# Patient Record
Sex: Male | Born: 1945 | Race: White | Hispanic: No | Smoking: Former smoker
Health system: Southern US, Community
[De-identification: ages and names within clinical notes are randomized; demographics above are authoritative.]

## PROBLEM LIST (undated history)

## (undated) DIAGNOSIS — G709 Myoneural disorder, unspecified: Secondary | ICD-10-CM

## (undated) DIAGNOSIS — L300 Nummular dermatitis: Secondary | ICD-10-CM

## (undated) DIAGNOSIS — N451 Epididymitis: Secondary | ICD-10-CM

## (undated) DIAGNOSIS — K5790 Diverticulosis of intestine, part unspecified, without perforation or abscess without bleeding: Secondary | ICD-10-CM

## (undated) DIAGNOSIS — F32A Depression, unspecified: Secondary | ICD-10-CM

## (undated) DIAGNOSIS — R079 Chest pain, unspecified: Secondary | ICD-10-CM

## (undated) DIAGNOSIS — N419 Inflammatory disease of prostate, unspecified: Secondary | ICD-10-CM

## (undated) DIAGNOSIS — K648 Other hemorrhoids: Secondary | ICD-10-CM

## (undated) DIAGNOSIS — M1990 Unspecified osteoarthritis, unspecified site: Secondary | ICD-10-CM

## (undated) DIAGNOSIS — K219 Gastro-esophageal reflux disease without esophagitis: Secondary | ICD-10-CM

## (undated) DIAGNOSIS — K4090 Unilateral inguinal hernia, without obstruction or gangrene, not specified as recurrent: Secondary | ICD-10-CM

## (undated) DIAGNOSIS — Z8639 Personal history of other endocrine, nutritional and metabolic disease: Secondary | ICD-10-CM

## (undated) DIAGNOSIS — E785 Hyperlipidemia, unspecified: Secondary | ICD-10-CM

## (undated) DIAGNOSIS — F419 Anxiety disorder, unspecified: Secondary | ICD-10-CM

## (undated) DIAGNOSIS — E559 Vitamin D deficiency, unspecified: Secondary | ICD-10-CM

## (undated) HISTORY — DX: Nummular dermatitis: L30.0

## (undated) HISTORY — DX: Personal history of other endocrine, nutritional and metabolic disease: Z86.39

## (undated) HISTORY — PX: TONSILLECTOMY: SUR1361

## (undated) HISTORY — DX: Unilateral inguinal hernia, without obstruction or gangrene, not specified as recurrent: K40.90

## (undated) HISTORY — DX: Gastro-esophageal reflux disease without esophagitis: K21.9

## (undated) HISTORY — DX: Inflammatory disease of prostate, unspecified: N41.9

## (undated) HISTORY — PX: INGUINAL HERNIA REPAIR: SHX194

## (undated) HISTORY — DX: Chest pain, unspecified: R07.9

## (undated) HISTORY — DX: Other hemorrhoids: K64.8

## (undated) HISTORY — DX: Vitamin D deficiency, unspecified: E55.9

## (undated) HISTORY — DX: Hyperlipidemia, unspecified: E78.5

## (undated) HISTORY — DX: Diverticulosis of intestine, part unspecified, without perforation or abscess without bleeding: K57.90

## (undated) HISTORY — DX: Epididymitis: N45.1

---

## 2003-04-29 ENCOUNTER — Ambulatory Visit (HOSPITAL_COMMUNITY): Admission: RE | Admit: 2003-04-29 | Discharge: 2003-04-29 | Payer: Self-pay | Admitting: Family Medicine

## 2003-07-12 ENCOUNTER — Ambulatory Visit (HOSPITAL_COMMUNITY): Admission: RE | Admit: 2003-07-12 | Discharge: 2003-07-12 | Payer: Self-pay | Admitting: Gastroenterology

## 2004-02-10 ENCOUNTER — Ambulatory Visit (HOSPITAL_COMMUNITY): Admission: RE | Admit: 2004-02-10 | Discharge: 2004-02-10 | Payer: Self-pay | Admitting: Family Medicine

## 2008-07-28 ENCOUNTER — Ambulatory Visit (HOSPITAL_COMMUNITY): Admission: RE | Admit: 2008-07-28 | Discharge: 2008-07-28 | Payer: Self-pay | Admitting: Surgery

## 2010-02-12 ENCOUNTER — Encounter: Payer: Self-pay | Admitting: Family Medicine

## 2010-04-30 LAB — CBC
HCT: 42.8 % (ref 39.0–52.0)
Hemoglobin: 14.6 g/dL (ref 13.0–17.0)
MCHC: 34.1 g/dL (ref 30.0–36.0)
MCV: 88.7 fL (ref 78.0–100.0)
RBC: 4.83 MIL/uL (ref 4.22–5.81)
WBC: 4.3 10*3/uL (ref 4.0–10.5)

## 2010-04-30 LAB — DIFFERENTIAL
Basophils Relative: 1 % (ref 0–1)
Eosinophils Absolute: 0.1 10*3/uL (ref 0.0–0.7)
Eosinophils Relative: 2 % (ref 0–5)
Lymphs Abs: 1.3 10*3/uL (ref 0.7–4.0)
Monocytes Absolute: 0.3 10*3/uL (ref 0.1–1.0)
Monocytes Relative: 8 % (ref 3–12)

## 2010-04-30 LAB — BASIC METABOLIC PANEL
CO2: 31 mEq/L (ref 19–32)
Chloride: 107 mEq/L (ref 96–112)
Creatinine, Ser: 0.87 mg/dL (ref 0.4–1.5)
GFR calc Af Amer: >60 mL/min (ref >60)
Potassium: 4.9 mEq/L (ref 3.5–5.1)

## 2010-06-06 NOTE — Op Note (Signed)
Kyle Williamson, Kyle Williamson              ACCOUNT NO.:  [id number]   MEDICAL RECORD NO.:  [id number]          PATIENT TYPE:  AMB   LOCATION:  SDS                          FACILITY:  [facility]   PHYSICIAN:  Wilmon Arms. Corliss Skains, M.D. DATE OF BIRTH:  [birth year]   DATE OF PROCEDURE:  07/28/2008  DATE OF DISCHARGE:  07/28/2008                               OPERATIVE REPORT   PREOPERATIVE DIAGNOSIS:  Left inguinal hernia.   POSTOPERATIVE DIAGNOSIS:  Left direct inguinal hernia.   PROCEDURE PERFORMED:  Left inguinal hernia repair with mesh.   SURGEON:  Wilmon Arms. Corliss Skains, MD   ANESTHESIA:  General via LMA.   INDICATIONS:  This is an healthy 65 year old male who presents with 3-  month history of a palpable bulge in his left groin, which has become  easily visible.  It is reducible.  He is minimally symptomatic.  He was  examined in the office and was felt to have a left inguinal hernia.  Presents now for repair.   DESCRIPTION OF PROCEDURE:  The patient was brought to the operating room  and placed in supine position on the operating table.  After an adequate  level of general anesthesia was obtained, the patient's left groin was  shaved and prepped with Betadine and draped in sterile fashion.  A time-  out was taken to assure proper patient and proper procedure.  We  infiltrated the area above the left inguinal ligament with 0.25%  Marcaine with epinephrine.  We made an oblique incision and dissection  was carried down in left fascia.  External oblique fascia was opened  along and retracted in its fibers down to the external ring.  We bluntly  dissected around a very diminutive spermatic cord.  The patient has a  large direct hernia defect.  The direct hernia bulge was dissected free  from the surrounding tissue and reduced up on the preperitoneal space.  The floor of the inguinal canal was then closed with a running zero PDS  suture.  The spermatic cord was examined and there is no sign of  indirect hernia.  Once pro mesh was cut into keyhole shape, wound was  secured at beginning at the pubic tubercle with 2-0 Prolene.  We ran  this along the shelving edge inferiorly and the internal oblique fascia  superiorly.  The tails were sutured together behind the spermatic cord.  Tails were then tucked underneath the external oblique fascia.  The  fascia was reapproximated with 2-0 Vicryl.  A 3-0 Vicryl was used to  close subcutaneous tissues and 4-0 Monocryl was used to close the skin.  Steri-Strips and clean dressings were applied.  The patient was then  extubated and brought to recovery room in stable condition.  All sponge,  instrument, and needle counts were correct.      Wilmon Arms. Tsuei, M.D.  Electronically Signed    MKT/MEDQ  D:  07/28/2008  T:  07/29/2008  Job:  [id number]   cc:   Tally Joe, M.D.

## 2010-06-09 NOTE — Op Note (Signed)
Kyle Williamson, Kyle Williamson                        ACCOUNT NO.:  [id number]   MEDICAL RECORD NO.:  [id number]                   PATIENT TYPE:  AMB   LOCATION:  ENDO                                 FACILITY:  [facility]   PHYSICIAN:  Graylin Shiver, M.D.                DATE OF BIRTH:  [birth year]   DATE OF PROCEDURE:  07/12/2003  DATE OF DISCHARGE:                                 OPERATIVE REPORT   PROCEDURE:  Colonoscopy.   INDICATIONS FOR PROCEDURE:  Rectal bleeding.   CONSENT:  Informed consent was obtained after explanation of the risks of  bleeding, infection, and perforation.   PREMEDICATION:  Fentanyl 75 mcg IV, Versed 8 mg IV.   DESCRIPTION OF PROCEDURE:  With the patient in the left lateral decubitus  position, the rectal exam was performed.  No masses were felt.  The Olympus  colonoscope was inserted into the rectum and advanced around the colon to  the cecum.  Cecal landmarks were identified.  The cecum and ascending colon  were normal.  The transverse colon was normal.  The descending colon was  normal.  The sigmoid showed some diverticula.  The rectum was normal.  He  tolerated the procedure well without complications.   IMPRESSION:  Diverticulosis of the sigmoid colon.                                               Graylin Shiver, M.D.    Germain Osgood  D:  07/12/2003  T:  07/12/2003  Job:  [id number]   cc:   Tally Joe, M.D.  [address]  [place], [place] [address]  Fax: [phone]

## 2012-01-30 ENCOUNTER — Other Ambulatory Visit: Payer: Self-pay | Admitting: Family Medicine

## 2012-01-30 DIAGNOSIS — R109 Unspecified abdominal pain: Secondary | ICD-10-CM

## 2012-01-31 ENCOUNTER — Ambulatory Visit
Admission: RE | Admit: 2012-01-31 | Discharge: 2012-01-31 | Disposition: A | Discharge status: Home / Self Care | Payer: Medicare Other | Source: Ambulatory Visit | Attending: Family Medicine | Admitting: Family Medicine

## 2012-01-31 DIAGNOSIS — R109 Unspecified abdominal pain: Secondary | ICD-10-CM

## 2012-01-31 MED ORDER — IOHEXOL 300 MG/ML  SOLN
100.0000 mL | Freq: Once | INTRAMUSCULAR | Status: AC | PRN
  Administered 2012-01-31: 100 mL via INTRAVENOUS

## 2012-07-03 ENCOUNTER — Other Ambulatory Visit: Payer: Self-pay | Admitting: Family Medicine

## 2012-07-03 DIAGNOSIS — R911 Solitary pulmonary nodule: Secondary | ICD-10-CM

## 2012-07-09 ENCOUNTER — Ambulatory Visit
Admission: RE | Admit: 2012-07-09 | Discharge: 2012-07-09 | Disposition: A | Discharge status: Home / Self Care | Payer: Medicare Other | Source: Ambulatory Visit | Attending: Family Medicine | Admitting: Family Medicine

## 2012-07-09 DIAGNOSIS — R911 Solitary pulmonary nodule: Secondary | ICD-10-CM

## 2012-07-09 MED ORDER — IOHEXOL 300 MG/ML  SOLN
75.0000 mL | Freq: Once | INTRAMUSCULAR | Status: AC | PRN
  Administered 2012-07-09: 75 mL via INTRAVENOUS

## 2014-03-05 ENCOUNTER — Other Ambulatory Visit: Payer: Self-pay | Admitting: Family Medicine

## 2014-03-05 DIAGNOSIS — Z139 Encounter for screening, unspecified: Secondary | ICD-10-CM

## 2014-03-22 ENCOUNTER — Ambulatory Visit
Admission: RE | Admit: 2014-03-22 | Discharge: 2014-03-22 | Disposition: A | Discharge status: Home / Self Care | Payer: Medicare Other | Source: Ambulatory Visit | Attending: Family Medicine | Admitting: Family Medicine

## 2014-03-22 DIAGNOSIS — Z139 Encounter for screening, unspecified: Secondary | ICD-10-CM

## 2016-01-30 IMAGING — US US AORTA SCREENING (MEDICARE)
1 series · 12 of 12 positions shown · non-contrast
Comparison: CT scan of January 31, 2012.

CLINICAL DATA: Medicare screening exam for abdominal aortic
aneurysm.

EXAM:
ABDOMINAL AORTA SCREENING ULTRASOUND
TECHNIQUE: Ultrasound examination of the abdominal aorta was performed as a
screening evaluation for abdominal aortic aneurysm.

[Series 1: us aorta screening (medicare) · 0.26mm/px · 12 of 12 slices shown]
[im 1/12]
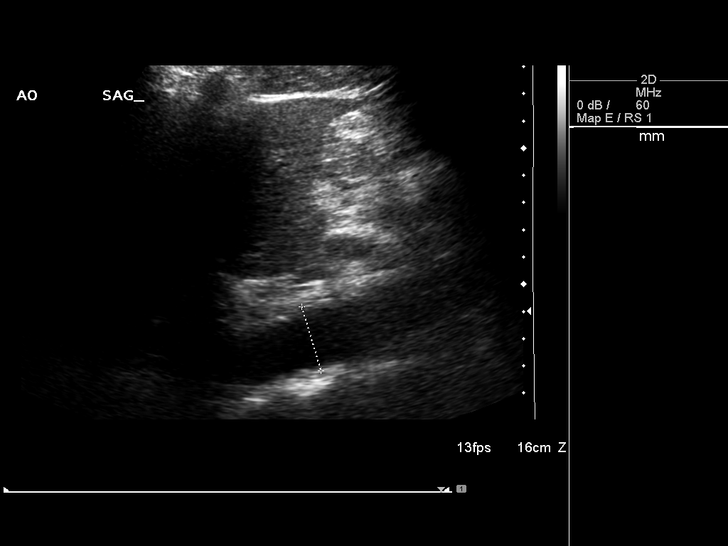
[im 2/12]
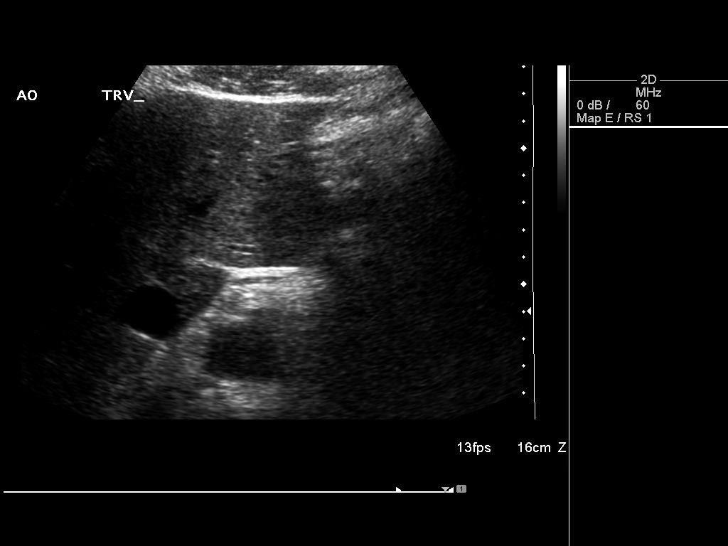
[im 3/12]
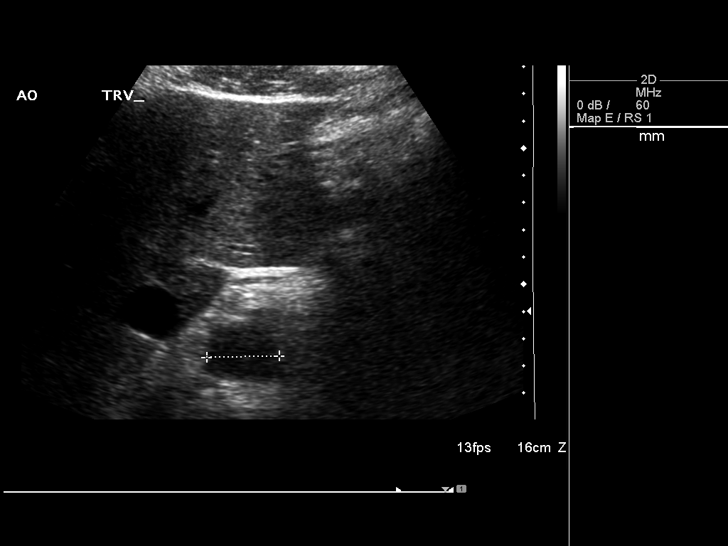
[im 4/12]
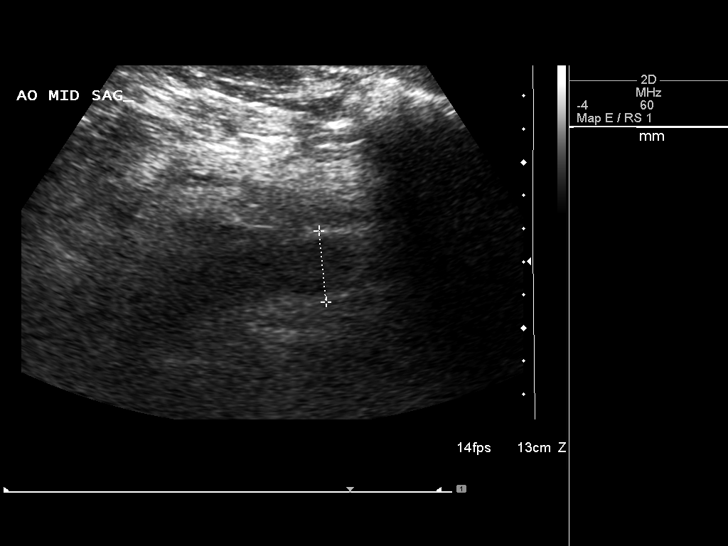
[im 5/12]
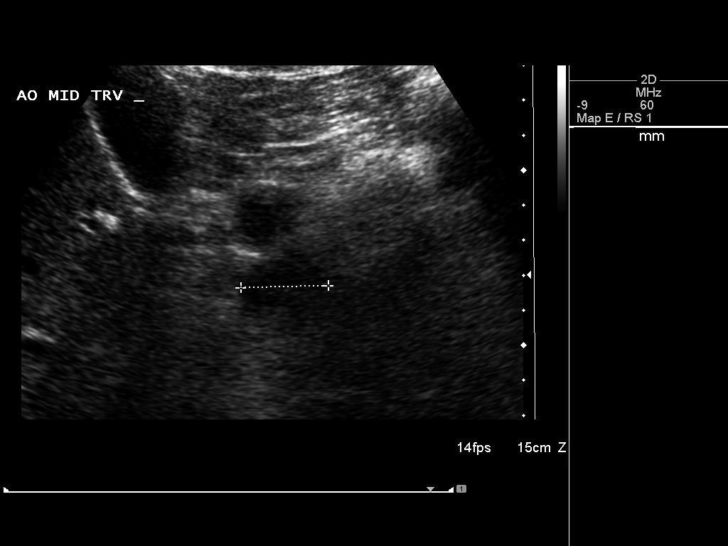
[im 6/12]
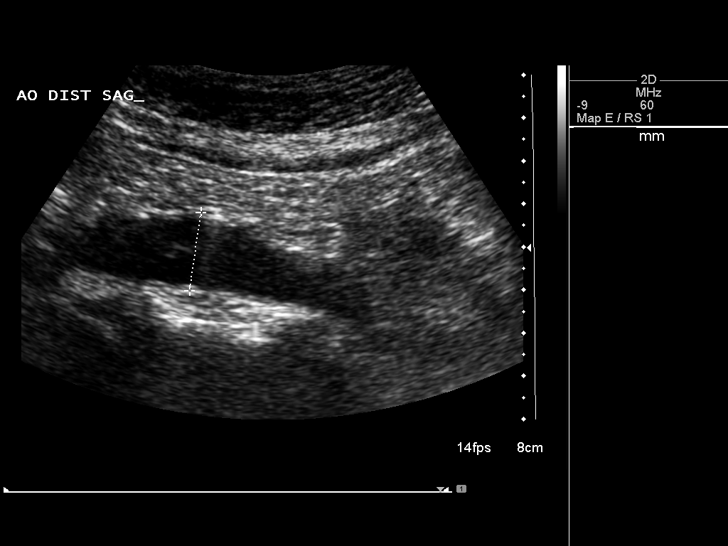
[im 7/12]
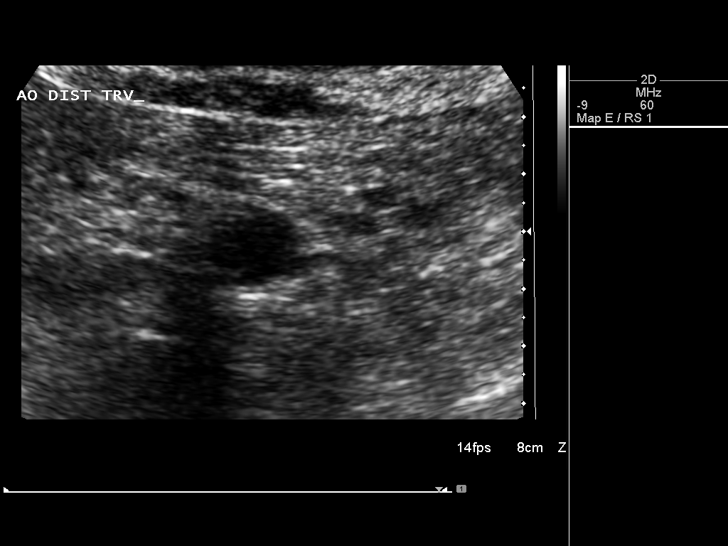
[im 8/12]
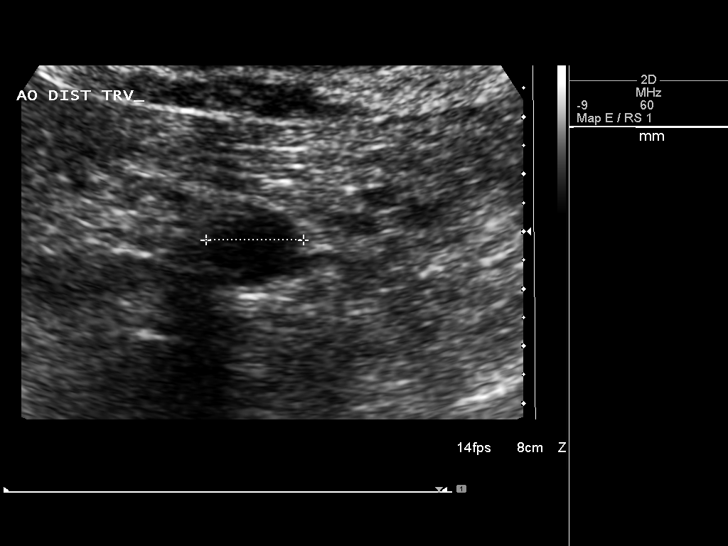
[im 9/12]
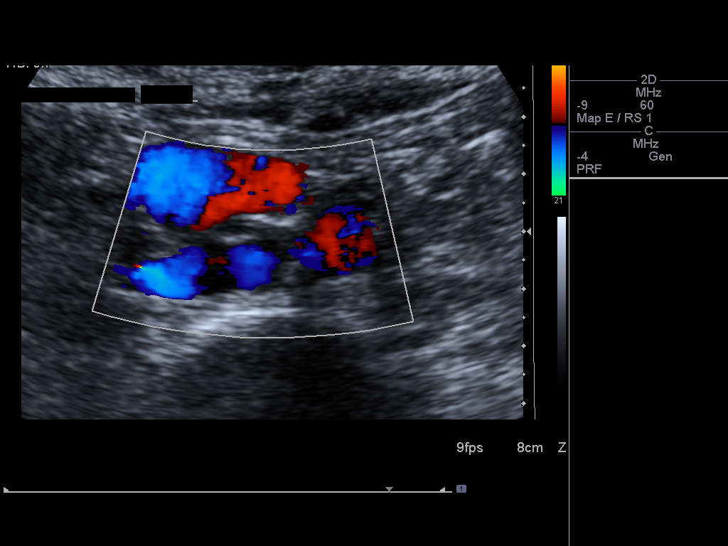
[im 10/12]
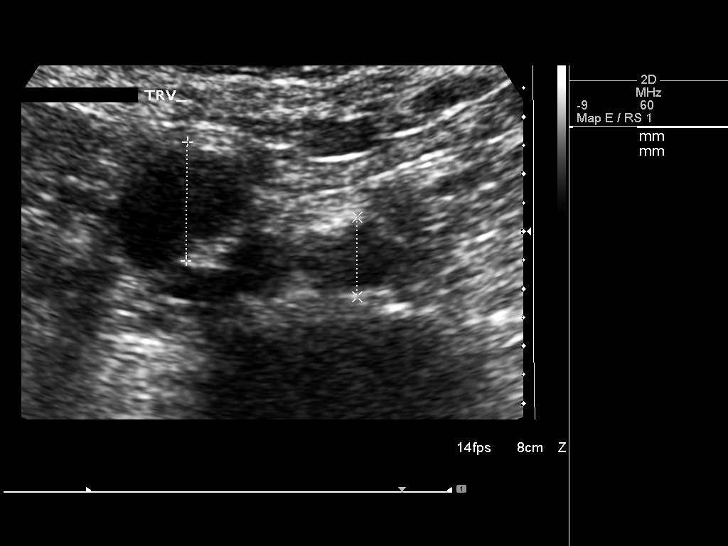
[im 11/12]
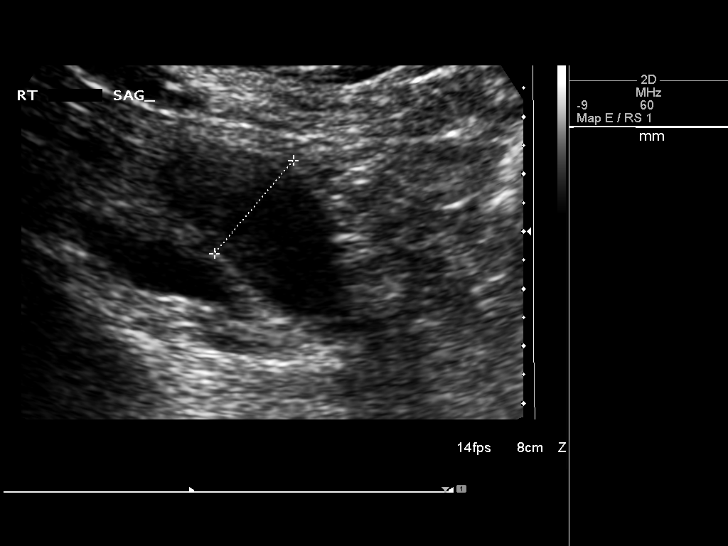
[im 12/12]
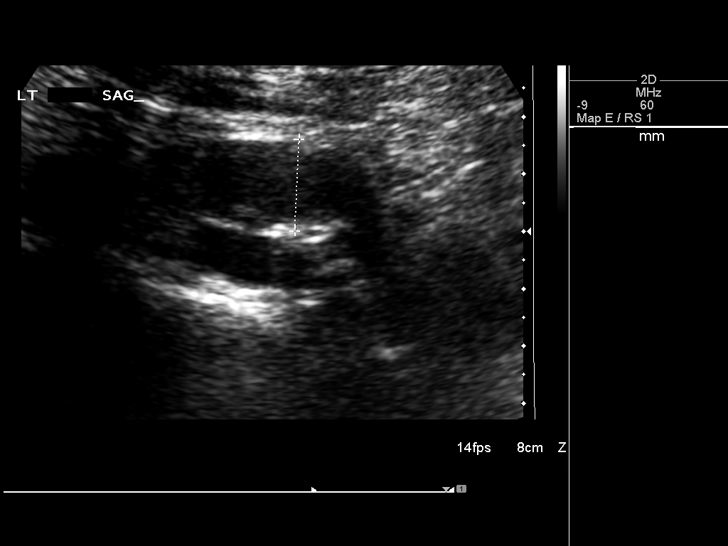

[12 of 12 positions shown; findings below may reference images not displayed]

FINDINGS: Abdominal Aorta

No aneurysm identified.

Maximum AP

Diameter:  2.5 cm proximally.

Maximum TRV

Diameter: 2.7 cm proximally.

Right common iliac artery has maximum measured diameter of 2.1 cm.
Left common iliac artery has max measured diameter 1.6 cm.
IMPRESSION: No evidence of abdominal aortic aneurysm. Aneurysmal dilatation of
right common iliac artery is noted with maximum measured diameter of
2.1 cm.

## 2016-03-19 ENCOUNTER — Other Ambulatory Visit: Payer: Self-pay | Admitting: Family Medicine

## 2016-03-19 DIAGNOSIS — I723 Aneurysm of iliac artery: Secondary | ICD-10-CM

## 2016-03-29 ENCOUNTER — Ambulatory Visit
Admission: RE | Admit: 2016-03-29 | Discharge: 2016-03-29 | Disposition: A | Discharge status: Home / Self Care | Payer: Medicare Other | Source: Ambulatory Visit | Attending: Family Medicine | Admitting: Family Medicine

## 2016-03-29 DIAGNOSIS — I723 Aneurysm of iliac artery: Secondary | ICD-10-CM

## 2016-07-05 ENCOUNTER — Ambulatory Visit (INDEPENDENT_AMBULATORY_CARE_PROVIDER_SITE_OTHER): Payer: Medicare Other | Admitting: Family Medicine

## 2016-07-05 ENCOUNTER — Ambulatory Visit (INDEPENDENT_AMBULATORY_CARE_PROVIDER_SITE_OTHER): Payer: Medicare Other

## 2016-07-05 ENCOUNTER — Encounter: Payer: Self-pay | Admitting: Family Medicine

## 2016-07-05 VITALS — BP 134/60 | HR 76 | Temp 97.8°F | Resp 18 | Ht 67.5 in | Wt 160.2 lb

## 2016-07-05 DIAGNOSIS — Y92009 Unspecified place in unspecified non-institutional (private) residence as the place of occurrence of the external cause: Secondary | ICD-10-CM

## 2016-07-05 DIAGNOSIS — M25532 Pain in left wrist: Secondary | ICD-10-CM

## 2016-07-05 DIAGNOSIS — W19XXXA Unspecified fall, initial encounter: Secondary | ICD-10-CM

## 2016-07-05 NOTE — Progress Notes (Signed)
  Chief Complaint  Patient presents with  . Wrist Injury    Right wrist, fell yesterday and tried catching self, swelling, and hurts to move.    HPI  Pt reports that he fell yesterday  He was trying to catch himself  He fell onto the outstretched right hand and experienced pain and mild swelling He iced it and put on a wrist brace  No past medical history on file.  Current Outpatient Prescriptions  Medication Sig Dispense Refill  . aspirin 81 MG tablet Take 81 mg by mouth daily.    . Turmeric (CURCUMIN 95 PO) Take by mouth.     No current facility-administered medications for this visit.     Allergies:  Allergies  Allergen Reactions  . Simvastatin Other (See Comments)    Per PT joint pain and muscle pain    No past surgical history on file.  Social History   Social History  . Marital status: Divorced    Spouse name: N/A  . Number of children: N/A  . Years of education: N/A   Social History Main Topics  . Smoking status: Former Smoker    Types: Cigarettes  . Smokeless tobacco: Never Used  . Alcohol use None  . Drug use: Unknown  . Sexual activity: Not Asked   Other Topics Concern  . None   Social History Narrative  . None    ROS No fevers or chills No n/v/d  Objective: Vitals:   07/05/16 1538  BP: 134/60  Pulse: 76  Resp: 18  Temp: 97.8 F (36.6 C)  TempSrc: Oral  SpO2: 96%  Weight: 160 lb 3.2 oz (72.7 kg)  Height: 5' 7.5" (1.715 m)    Physical Exam Wrist on right tender on the dorsum No visible bony change Cap refill <2s Snuff box nontender No pain with pronation or supination  FINDINGS: First metacarpal/ carpal degenerative changes. There are also carpal carpal degenerative changes involving the capitate and lunate. Mild radiocarpal joint degenerative changes. Mild first MCP joint degenerative changes. No fracture or dislocation seen.  IMPRESSION: 1. No fracture. 2. Multi joint degenerative changes.   Electronically  Signed   By: Beckie SaltsSteven  Reid M.D.   On: 07/05/2016 16:39  Assessment and Plan Chanetta MarshallJimmy was seen today for wrist injury.  Diagnoses and all orders for this visit:  Left wrist pain -     DG Wrist Complete Right  Fall in home, initial encounter -     DG Wrist Complete Right  Rest, Ice, Elevation, and continue wrist wrap   Jet Traynham A Creta LevinStallings

## 2016-07-05 NOTE — Patient Instructions (Addendum)
   IF you received an x-ray today, you will receive an invoice from [facility]. Please contact [facility] at [phone] with questions or concerns regarding your invoice.   IF you received labwork today, you will receive an invoice from [facility]. Please contact [facility] at [phone] with questions or concerns regarding your invoice.   Our billing staff will not be able to assist you with questions regarding bills from these companies.  You will be contacted with the lab results as soon as they are available. The fastest way to get your results is to activate your My Chart account. Instructions are located on the last page of this paperwork. If you have not heard from us regarding the results in 2 weeks, please contact this office.     Wrist Sprain, Adult A wrist sprain is a stretch or tear in the strong, fibrous tissues (ligaments) that connect your wrist bones. There are three types of wrist sprains:  Grade 1. In this type of sprain, the ligament is stretched more than normal.  Grade 2. In this type of sprain, the ligament is partially torn. You may be able to move your wrist, but not very much.  Grade 3. In this type of sprain, the ligament or muscle is completely torn. You may find it difficult or extremely painful to move your wrist even a little.  What are the causes? A wrist sprain can be caused by using the wrist too much during sports, exercise, or at work. It can also happen with a fall or during an accident. What increases the risk? This condition is more likely to occur in people:  With a previous wrist or arm injury.  With poor wrist strength and flexibility.  Who play contact sports, such as football or soccer.  Who play sports that may result in a fall, such as skateboarding, biking, skiing, or snowboarding.  Who do not exercise regularly.  Who use exercise equipment that does not fit well.  What are the signs or symptoms? Symptoms of  this condition include:  Pain in the wrist, arm, or hand.  Swelling or bruised skin near the wrist, hand, or arm. The skin may look yellow or kind of blue.  Stiffness or trouble moving the hand.  Hearing a pop or feeling a tear at the time of the injury.  A warm feeling in the skin around the wrist.  How is this diagnosed? This condition is diagnosed with a physical exam. Sometimes an X-ray is taken to make sure a bone did not break. If your health care provider thinks that you tore a ligament, he or she may order an MRI of your wrist. How is this treated? This condition is treated by resting and applying ice to your wrist. Additional treatment may include:  Medicine for pain and inflammation.  A splint to keep your wrist still (immobilized).  Exercises to strengthen and stretch your wrist.  Surgery. This may be done if the ligament is completely torn.  Follow these instructions at home: If you have a splint:   Do not put pressure on any part of the splint until it is fully hardened. This may take several hours.  Wear the splint as told by your health care provider. Remove it only as told by your health care provider.  Loosen the splint if your fingers tingle, become numb, or turn cold and blue.  If your splint is not waterproof: ? Do not let it get wet. ? Cover it with   a watertight covering when you take a bath or a shower.  Keep the splint clean. Managing pain, stiffness, and swelling   If directed, put ice on the injured area. ? If you have a removable splint, remove it as told by your health care provider. ? Put ice in a plastic bag. ? Place a towel between your skin and the bag or between the splint and the bag. ? Leave the ice on for 20 minutes, 2-3 times per day.  Move your fingers often to avoid stiffness and to lessen swelling.  Raise (elevate) the injured area above the level of your heart while you are sitting or lying down. Activity  Rest your wrist.  Do not do things that cause pain.  Return to your normal activities as told by your health care provider. Ask your health care provider what activities are safe for you.  Do exercises as told by your health care provider. General instructions  Take over-the-counter and prescription medicines only as told by your health care provider.  Do not use any products that contain nicotine or tobacco, such as cigarettes and e-cigarettes. These can delay healing. If you need help quitting, ask your health care provider.  Ask your health care provider when it is safe to drive if you have a splint.  Keep all follow-up visits as told by your health care provider. This is important. Contact a health care provider if:  Your pain, bruising, or swelling gets worse.  Your skin becomes red, gets a rash, or has open sores.  Your pain does not get better or it gets worse. Get help right away if:  You have a new or sudden sharp pain in the hand, arm, or wrist.  You have tingling or numbness in your hand.  Your fingers turn white, very red, or cold and blue.  You cannot move your fingers. This information is not intended to replace advice given to you by your health care provider. Make sure you discuss any questions you have with your health care provider. Document Released: 09/11/2013 Document Revised: 08/06/2015 Document Reviewed: 07/28/2015 Elsevier Interactive Patient Education  2017 [employer]  

## 2017-05-24 ENCOUNTER — Other Ambulatory Visit: Payer: Self-pay | Admitting: Family Medicine

## 2017-06-18 ENCOUNTER — Other Ambulatory Visit (HOSPITAL_COMMUNITY): Payer: Self-pay | Admitting: Family Medicine

## 2017-06-18 ENCOUNTER — Ambulatory Visit (HOSPITAL_COMMUNITY)
Admission: RE | Admit: 2017-06-18 | Discharge: 2017-06-18 | Disposition: A | Discharge status: Home / Self Care | Payer: Medicare Other | Source: Ambulatory Visit | Attending: Vascular Surgery | Admitting: Vascular Surgery

## 2017-06-18 DIAGNOSIS — I723 Aneurysm of iliac artery: Secondary | ICD-10-CM | POA: Diagnosis not present

## 2018-05-15 IMAGING — DX DG WRIST COMPLETE 3+V*R*
4 series · 4 of 4 positions shown · non-contrast
Comparison: None.

CLINICAL DATA: Right wrist pain following a fall yesterday.

EXAM:
RIGHT WRIST - COMPLETE 3+ VIEW

[wrist pa]
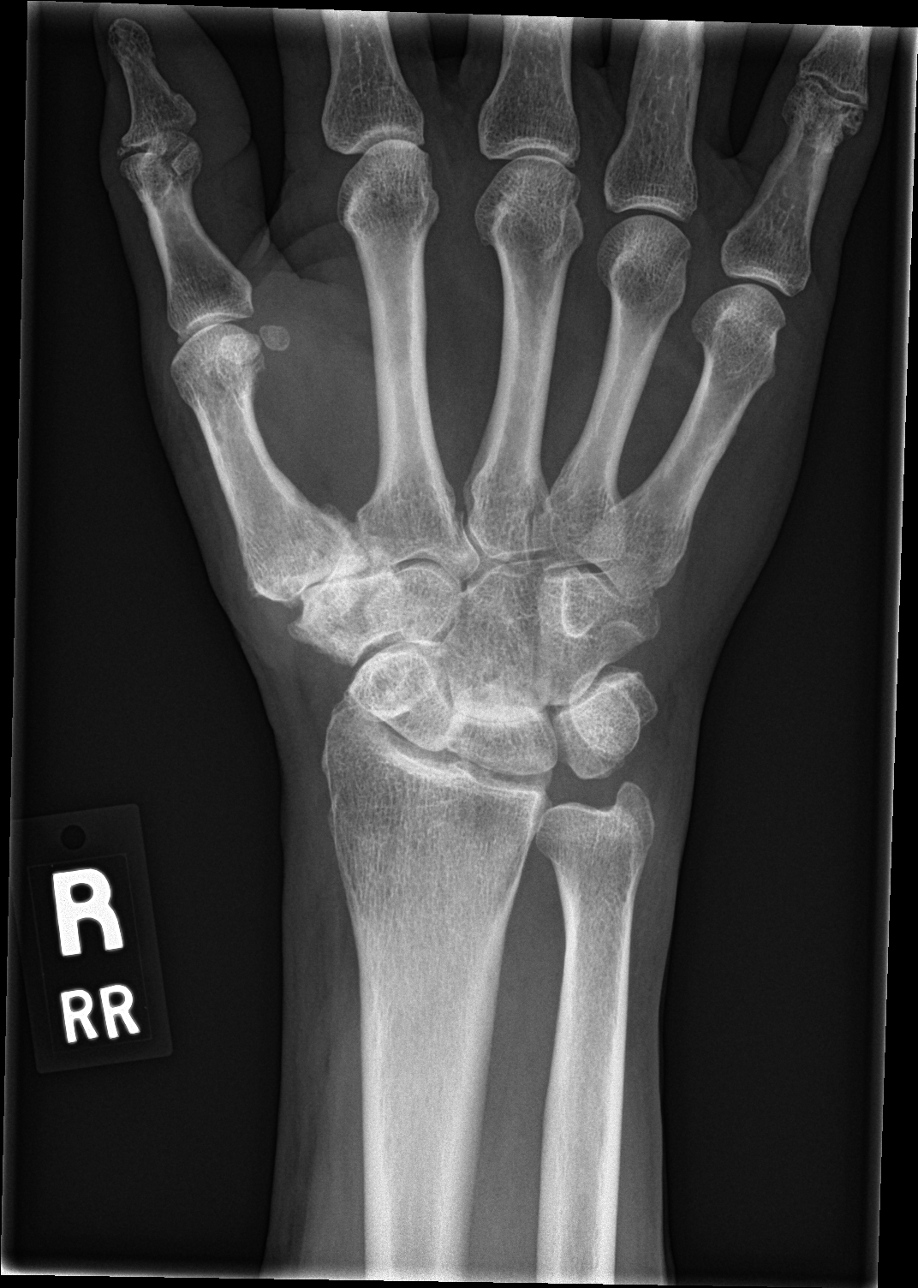

[wrist obl]
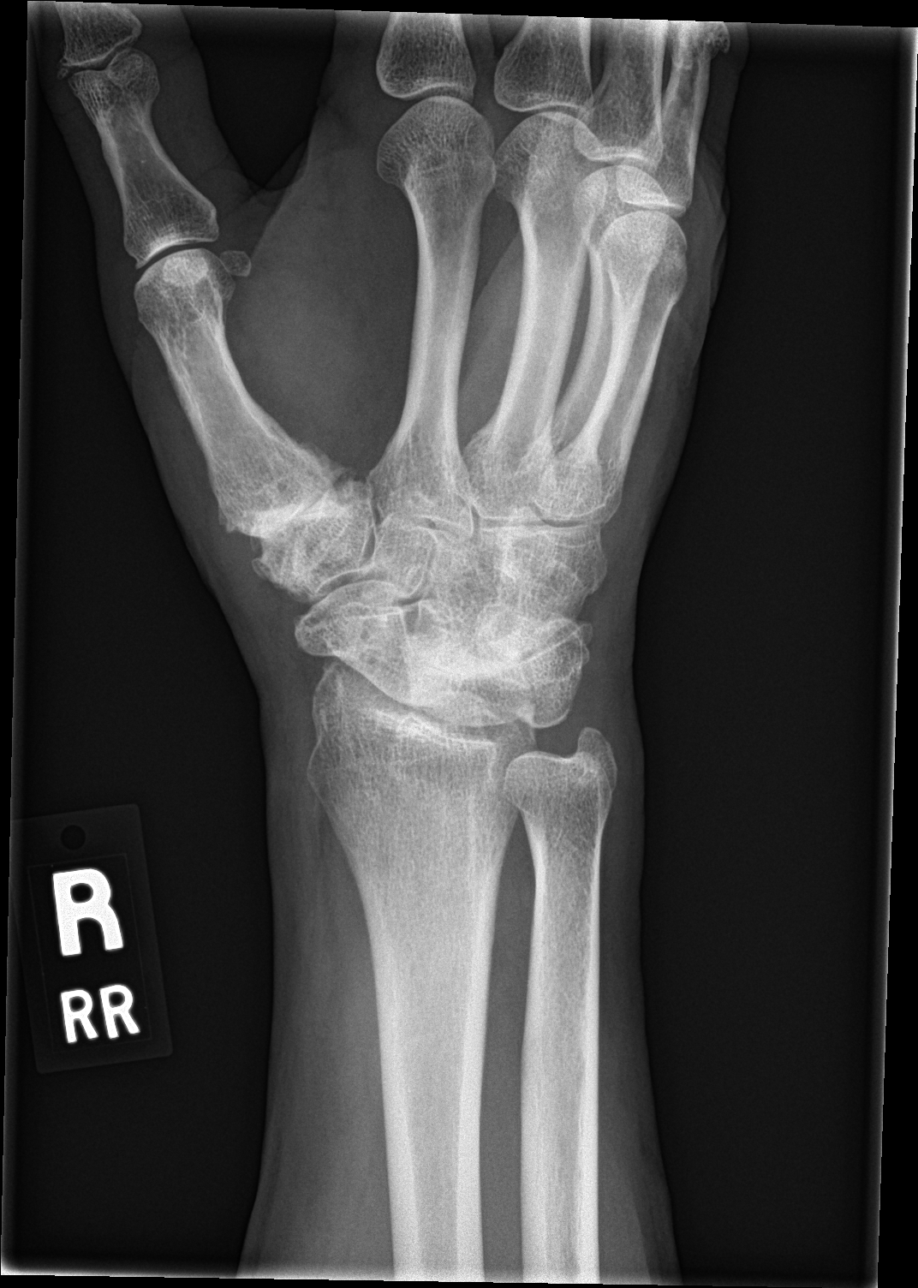

[wrist lat]
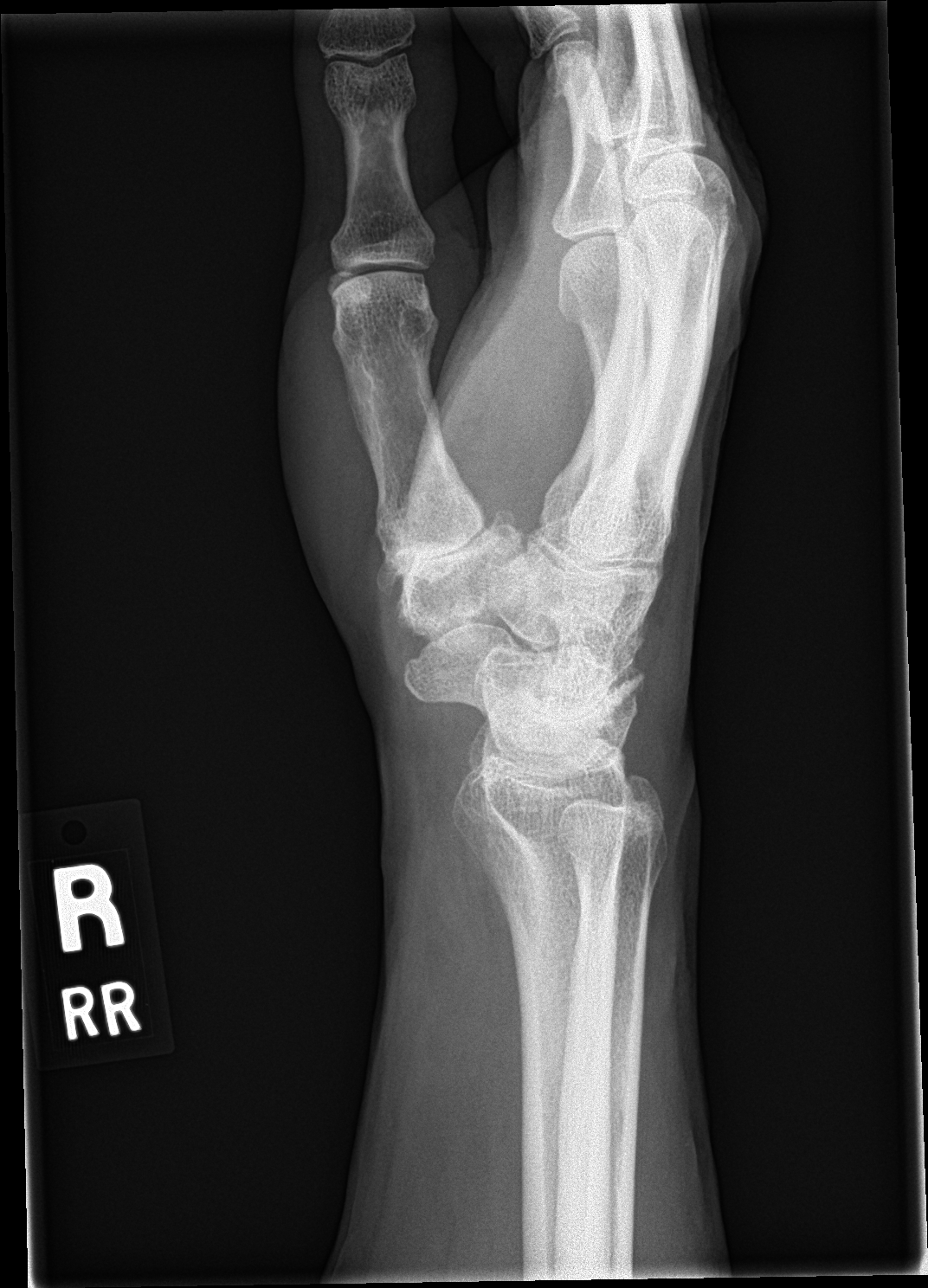

[wrist navicular]
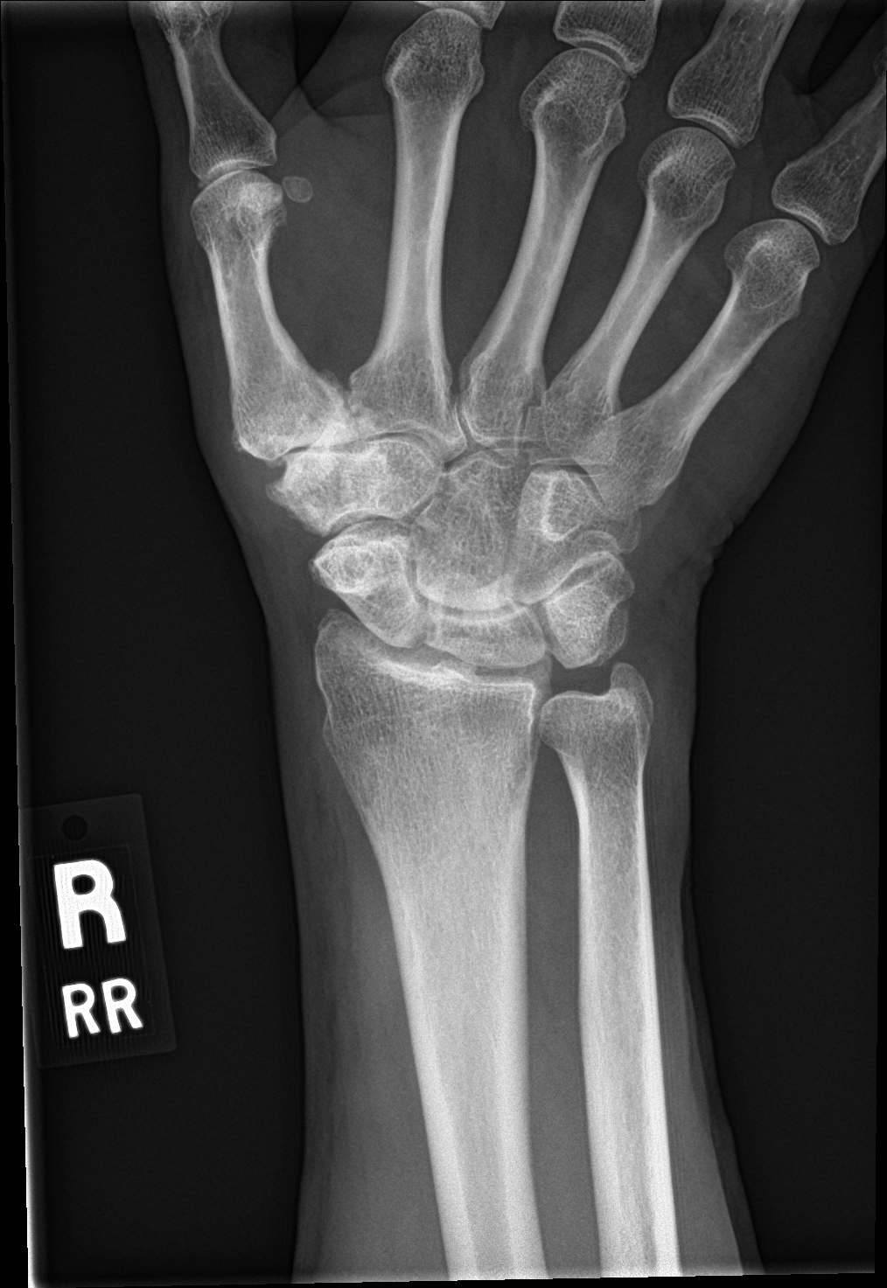

[4 of 4 positions shown; findings below may reference images not displayed]

FINDINGS: First metacarpal/ carpal degenerative changes. There are also carpal
carpal degenerative changes involving the capitate and lunate. Mild
radiocarpal joint degenerative changes. Mild first MCP joint
degenerative changes. No fracture or dislocation seen.
IMPRESSION: 1. No fracture.
2. Multi joint degenerative changes.

## 2019-11-11 DIAGNOSIS — H2513 Age-related nuclear cataract, bilateral: Secondary | ICD-10-CM | POA: Diagnosis not present

## 2020-03-23 DIAGNOSIS — M19172 Post-traumatic osteoarthritis, left ankle and foot: Secondary | ICD-10-CM | POA: Diagnosis not present

## 2020-04-07 DIAGNOSIS — R1030 Lower abdominal pain, unspecified: Secondary | ICD-10-CM | POA: Diagnosis not present

## 2020-04-07 DIAGNOSIS — R194 Change in bowel habit: Secondary | ICD-10-CM | POA: Diagnosis not present

## 2020-05-25 ENCOUNTER — Other Ambulatory Visit: Payer: Self-pay | Admitting: Gastroenterology

## 2020-05-25 ENCOUNTER — Ambulatory Visit
Admission: RE | Admit: 2020-05-25 | Discharge: 2020-05-25 | Disposition: A | Discharge status: Home / Self Care | Payer: Medicare Other | Source: Ambulatory Visit | Attending: Gastroenterology | Admitting: Gastroenterology

## 2020-05-25 DIAGNOSIS — K5900 Constipation, unspecified: Secondary | ICD-10-CM

## 2020-05-25 DIAGNOSIS — R1030 Lower abdominal pain, unspecified: Secondary | ICD-10-CM | POA: Diagnosis not present

## 2020-05-25 DIAGNOSIS — Z1211 Encounter for screening for malignant neoplasm of colon: Secondary | ICD-10-CM | POA: Diagnosis not present

## 2020-05-25 DIAGNOSIS — R109 Unspecified abdominal pain: Secondary | ICD-10-CM | POA: Diagnosis not present

## 2020-06-22 DIAGNOSIS — K5730 Diverticulosis of large intestine without perforation or abscess without bleeding: Secondary | ICD-10-CM | POA: Diagnosis not present

## 2020-06-22 DIAGNOSIS — Z86010 Personal history of colonic polyps: Secondary | ICD-10-CM | POA: Diagnosis not present

## 2020-08-15 DIAGNOSIS — E782 Mixed hyperlipidemia: Secondary | ICD-10-CM | POA: Diagnosis not present

## 2020-08-15 DIAGNOSIS — Z1283 Encounter for screening for malignant neoplasm of skin: Secondary | ICD-10-CM | POA: Diagnosis not present

## 2020-08-15 DIAGNOSIS — E559 Vitamin D deficiency, unspecified: Secondary | ICD-10-CM | POA: Diagnosis not present

## 2020-08-15 DIAGNOSIS — Z125 Encounter for screening for malignant neoplasm of prostate: Secondary | ICD-10-CM | POA: Diagnosis not present

## 2020-08-15 DIAGNOSIS — Z0000 Encounter for general adult medical examination without abnormal findings: Secondary | ICD-10-CM | POA: Diagnosis not present

## 2021-01-22 HISTORY — PX: COLONOSCOPY: SHX174

## 2021-02-14 DIAGNOSIS — L570 Actinic keratosis: Secondary | ICD-10-CM | POA: Diagnosis not present

## 2021-02-14 DIAGNOSIS — L218 Other seborrheic dermatitis: Secondary | ICD-10-CM | POA: Diagnosis not present

## 2021-02-14 DIAGNOSIS — D1801 Hemangioma of skin and subcutaneous tissue: Secondary | ICD-10-CM | POA: Diagnosis not present

## 2021-02-14 DIAGNOSIS — L738 Other specified follicular disorders: Secondary | ICD-10-CM | POA: Diagnosis not present

## 2021-02-14 DIAGNOSIS — L84 Corns and callosities: Secondary | ICD-10-CM | POA: Diagnosis not present

## 2021-02-14 DIAGNOSIS — L821 Other seborrheic keratosis: Secondary | ICD-10-CM | POA: Diagnosis not present

## 2021-07-17 DIAGNOSIS — M19172 Post-traumatic osteoarthritis, left ankle and foot: Secondary | ICD-10-CM | POA: Diagnosis not present

## 2021-07-17 DIAGNOSIS — M1612 Unilateral primary osteoarthritis, left hip: Secondary | ICD-10-CM | POA: Diagnosis not present

## 2021-09-04 DIAGNOSIS — E782 Mixed hyperlipidemia: Secondary | ICD-10-CM | POA: Diagnosis not present

## 2021-09-04 DIAGNOSIS — Z125 Encounter for screening for malignant neoplasm of prostate: Secondary | ICD-10-CM | POA: Diagnosis not present

## 2021-09-04 DIAGNOSIS — Z0000 Encounter for general adult medical examination without abnormal findings: Secondary | ICD-10-CM | POA: Diagnosis not present

## 2021-09-04 DIAGNOSIS — N400 Enlarged prostate without lower urinary tract symptoms: Secondary | ICD-10-CM | POA: Diagnosis not present

## 2021-09-04 DIAGNOSIS — F411 Generalized anxiety disorder: Secondary | ICD-10-CM | POA: Diagnosis not present

## 2021-09-05 DIAGNOSIS — H2513 Age-related nuclear cataract, bilateral: Secondary | ICD-10-CM | POA: Diagnosis not present

## 2021-09-05 DIAGNOSIS — H5203 Hypermetropia, bilateral: Secondary | ICD-10-CM | POA: Diagnosis not present

## 2021-09-05 DIAGNOSIS — H52223 Regular astigmatism, bilateral: Secondary | ICD-10-CM | POA: Diagnosis not present

## 2021-10-30 DIAGNOSIS — M79661 Pain in right lower leg: Secondary | ICD-10-CM | POA: Diagnosis not present

## 2021-12-18 DIAGNOSIS — Z23 Encounter for immunization: Secondary | ICD-10-CM | POA: Diagnosis not present

## 2022-04-25 DIAGNOSIS — H903 Sensorineural hearing loss, bilateral: Secondary | ICD-10-CM | POA: Diagnosis not present

## 2022-05-29 DIAGNOSIS — R079 Chest pain, unspecified: Secondary | ICD-10-CM | POA: Diagnosis not present

## 2022-05-29 DIAGNOSIS — F411 Generalized anxiety disorder: Secondary | ICD-10-CM | POA: Diagnosis not present

## 2022-05-29 DIAGNOSIS — K219 Gastro-esophageal reflux disease without esophagitis: Secondary | ICD-10-CM | POA: Diagnosis not present

## 2022-06-25 DIAGNOSIS — M19172 Post-traumatic osteoarthritis, left ankle and foot: Secondary | ICD-10-CM | POA: Diagnosis not present

## 2022-06-25 DIAGNOSIS — M1712 Unilateral primary osteoarthritis, left knee: Secondary | ICD-10-CM | POA: Diagnosis not present

## 2022-06-25 DIAGNOSIS — M1612 Unilateral primary osteoarthritis, left hip: Secondary | ICD-10-CM | POA: Diagnosis not present

## 2022-09-05 ENCOUNTER — Observation Stay (HOSPITAL_COMMUNITY)
Admission: EM | Admit: 2022-09-05 | Discharge: 2022-09-06 | Disposition: A | Discharge status: Home / Self Care | Payer: Medicare PPO | Source: Home / Self Care | Attending: Internal Medicine | Admitting: Internal Medicine

## 2022-09-05 ENCOUNTER — Other Ambulatory Visit: Payer: Self-pay

## 2022-09-05 ENCOUNTER — Encounter (HOSPITAL_COMMUNITY): Payer: Self-pay

## 2022-09-05 DIAGNOSIS — Z87891 Personal history of nicotine dependence: Secondary | ICD-10-CM | POA: Insufficient documentation

## 2022-09-05 DIAGNOSIS — K921 Melena: Secondary | ICD-10-CM | POA: Diagnosis not present

## 2022-09-05 DIAGNOSIS — K922 Gastrointestinal hemorrhage, unspecified: Secondary | ICD-10-CM | POA: Diagnosis not present

## 2022-09-05 DIAGNOSIS — R152 Fecal urgency: Secondary | ICD-10-CM | POA: Diagnosis not present

## 2022-09-05 DIAGNOSIS — Z7982 Long term (current) use of aspirin: Secondary | ICD-10-CM | POA: Insufficient documentation

## 2022-09-05 LAB — COMPREHENSIVE METABOLIC PANEL
ALT: 17 U/L (ref 0–44)
AST: 15 U/L (ref 15–41)
Albumin: 3.4 g/dL — ABNORMAL LOW (ref 3.5–5.0)
Alkaline Phosphatase: 42 U/L (ref 38–126)
Anion gap: 8 (ref 5–15)
BUN: 30 mg/dL — ABNORMAL HIGH (ref 8–23)
CO2: 25 mmol/L (ref 22–32)
Calcium: 8.6 mg/dL — ABNORMAL LOW (ref 8.9–10.3)
Chloride: 102 mmol/L (ref 98–111)
Creatinine, Ser: 0.72 mg/dL (ref 0.61–1.24)
GFR, Estimated: >60 mL/min (ref >60)
Glucose, Bld: 81 mg/dL (ref 70–99)
Potassium: 3.9 mmol/L (ref 3.5–5.1)
Sodium: 135 mmol/L (ref 135–145)
Total Bilirubin: 0.7 mg/dL (ref 0.3–1.2)
Total Protein: 5.9 g/dL — ABNORMAL LOW (ref 6.5–8.1)

## 2022-09-05 LAB — CBC
HCT: 42.2 % (ref 39.0–52.0)
Hemoglobin: 13.4 g/dL (ref 13.0–17.0)
MCH: 29.6 pg (ref 26.0–34.0)
MCHC: 31.8 g/dL (ref 30.0–36.0)
MCV: 93.2 fL (ref 80.0–100.0)
Platelets: 222 10*3/uL (ref 150–400)
RBC: 4.53 MIL/uL (ref 4.22–5.81)
RDW: 13.1 % (ref 11.5–15.5)
WBC: 5.7 10*3/uL (ref 4.0–10.5)
nRBC: 0 % (ref 0.0–0.2)

## 2022-09-05 LAB — TYPE AND SCREEN
ABO/RH(D): O POS
Antibody Screen: NEGATIVE

## 2022-09-05 LAB — POC OCCULT BLOOD, ED: Fecal Occult Bld: POSITIVE — AB

## 2022-09-05 LAB — HEMOGLOBIN AND HEMATOCRIT, BLOOD
HCT: 36 % — ABNORMAL LOW (ref 39.0–52.0)
Hemoglobin: 11.5 g/dL — ABNORMAL LOW (ref 13.0–17.0)

## 2022-09-05 LAB — ABO/RH: ABO/RH(D): O POS

## 2022-09-05 MED ORDER — ACETAMINOPHEN 650 MG RE SUPP: 650.0000 mg | Freq: Four times a day (QID) | RECTAL | Status: DC | PRN

## 2022-09-05 MED ORDER — ONDANSETRON HCL 4 MG/2ML IJ SOLN: 4.0000 mg | Freq: Four times a day (QID) | INTRAMUSCULAR | Status: DC | PRN

## 2022-09-05 MED ORDER — ONDANSETRON HCL 4 MG PO TABS: 4.0000 mg | ORAL_TABLET | Freq: Four times a day (QID) | ORAL | Status: DC | PRN

## 2022-09-05 MED ORDER — ACETAMINOPHEN 325 MG PO TABS: 650.0000 mg | ORAL_TABLET | Freq: Four times a day (QID) | ORAL | Status: DC | PRN

## 2022-09-05 MED ORDER — ALBUTEROL SULFATE (2.5 MG/3ML) 0.083% IN NEBU: 2.5000 mg | INHALATION_SOLUTION | RESPIRATORY_TRACT | Status: DC | PRN

## 2022-09-05 MED ORDER — TRAZODONE HCL 50 MG PO TABS: 25.0000 mg | ORAL_TABLET | Freq: Every evening | ORAL | Status: DC | PRN

## 2022-09-05 NOTE — ED Provider Notes (Signed)
[place] EMERGENCY DEPARTMENT AT [facility] Provider Note   CSN: [id number] Arrival date & time: 09/05/22  1224     History  Chief Complaint  Patient presents with   Rectal Bleeding    Kyle Williamson is a 77 y.o. male.  Patient is a 77 year old male with no significant past medical history except for mild depression who is presenting today with bloody stools that started last night.  He reports he has had 6 bloody stools in the last 12 hours.  Last night he felt like he needed to go to the bathroom and when he got to the bathroom he reported copious amounts of blood mixed with stool and then shortly after he had a second bowel movement.  There is no pain in his abdomen or when having the bowel movement.  He then reported 2 other episodes last night and then he had 2 episodes this morning.  He does report a history of hemorrhoids in the past but reports this does not seem anything like that episode.  His last bowel movement was at 11 and reports there was still blood mixed in with the stool and the stool appeared a little bit darker than usual.  He felt like there was less blood at 11:00 than there was last night.  No prior history of GI bleeding.  He does take an 81 mg aspirin but no other anticoagulants.  He is denying any chest pain, shortness of breath, lightheadedness or weakness.  He went to see his doctor at [facility] physicians today and they sent him here for further evaluation.  His last colonoscopy was 2 years ago and as far as he knows there were no significant findings.  The history is provided by the patient.  Rectal Bleeding      Home Medications Prior to Admission medications   Medication Sig Start Date End Date Taking? Authorizing Provider  aspirin 81 MG tablet Take 81 mg by mouth daily.    [provider]  Turmeric (CURCUMIN 95 PO) Take by mouth.    [provider]      Allergies    Simvastatin    Review of Systems   Review of Systems   Gastrointestinal:  Positive for hematochezia.    Physical Exam Updated Vital Signs BP (!) 162/75   Pulse 60   Temp 98.3 F (36.8 C) (Oral)   Resp 17   Ht 5\' 8"  (1.727 m)   Wt 77.1 kg   SpO2 97%   BMI 25.85 kg/m  Physical Exam Vitals and nursing note reviewed.  Constitutional:      General: He is not in acute distress.    Appearance: He is well-developed.  HENT:     Head: Normocephalic and atraumatic.  Eyes:     Conjunctiva/sclera: Conjunctivae normal.     Pupils: Pupils are equal, round, and reactive to light.  Cardiovascular:     Rate and Rhythm: Normal rate and regular rhythm.     Heart sounds: No murmur heard. Pulmonary:     Effort: Pulmonary effort is normal. No respiratory distress.     Breath sounds: Normal breath sounds. No wheezing or rales.  Abdominal:     General: There is no distension.     Palpations: Abdomen is soft.     Tenderness: There is no abdominal tenderness. There is no guarding or rebound.  Genitourinary:    Rectum: Guaiac result positive.     Comments: Dark blood present at the rectum, small  nontender noninflamed external hemorrhoid Musculoskeletal:        General: No tenderness. Normal range of motion.     Cervical back: Normal range of motion and neck supple.  Skin:    General: Skin is warm and dry.     Findings: No erythema or rash.  Neurological:     Mental Status: He is alert and oriented to person, place, and time.  Psychiatric:        Behavior: Behavior normal.     ED Results / Procedures / Treatments   Labs (all labs ordered are listed, but only abnormal results are displayed) Labs Reviewed  COMPREHENSIVE METABOLIC PANEL - Abnormal; Notable for the following components:      Result Value   BUN 30 (*)    Calcium 8.6 (*)    Total Protein 5.9 (*)    Albumin 3.4 (*)    All other components within normal limits  POC OCCULT BLOOD, ED - Abnormal; Notable for the following components:   Fecal Occult Bld POSITIVE (*)    All other  components within normal limits  CBC  POC OCCULT BLOOD, ED  TYPE AND SCREEN  ABO/RH    EKG None  Radiology No results found.  Procedures Procedures    Medications Ordered in ED Medications - No data to display  ED Course/ Medical Decision Making/ A&P                                 Medical Decision Making Amount and/or Complexity of Data Reviewed Labs: ordered. Decision-making details documented in ED Course.  Risk Decision regarding hospitalization.   Pt  presenting today with a complaint that caries a high risk for morbidity and mortality.  Here today with lower GI bleeding.  Uncertain the cause of the bleeding whether it is from internal hemorrhoids, AVM, diverticulosis.  Patient reports colonoscopy 2 years ago that was benign and thinks he had it done by [facility].  He currently is asymptomatic and last bloody stool was around 11 AM.  I independently interpreted patient's labs and Hemoccult was positive, CMP with normal creatinine and electrolytes but elevated BUN of 30 and CBC within normal limits with stable hemoglobin of 13.  Will consult with gastroenterology who will see the pt and requested admission to hospital.  [facility] hospitalist for admission.         Final Clinical Impression(s) / ED Diagnoses Final diagnoses:  Lower GI bleed    Rx / DC Orders ED Discharge Orders     None         Gwyneth Sprout, MD 09/05/22 1609

## 2022-09-05 NOTE — H&P (Signed)
History and Physical  Kyle Williamson UJW:[id number] DOB: May 07, 1945 DOA: 09/05/2022  PCP: Patient, No Pcp Per   Chief Complaint: Bleeding  HPI: Kyle Williamson is a 77 y.o. male with medical history significant for depression being admitted to the hospital with lower GI bleeding.  Patient states he has been in his usual state of health until last evening, when he had a loose bowel movement which was mostly bloody.  Since that time, he has had 5 more bowel movements through the night have become less loose, and more dark appearing.  He takes a daily aspirin, but is not on any other blood thinners.  Denies any abdominal pain, nausea, vomiting, dizziness, shortness of breath.  No prior history of GI bleeding.  He had a colonoscopy about 2 years ago which as far as he recalls was normal.  ED Course: Here in the emergency department vital signs have been mostly unremarkable except for some mild hypertension.  Fecal occult blood is positive, lab work including CBC and CMP are unremarkable.  ER provider discussed with [facility] GI on-call, who will see the patient in consultation.  Review of Systems: Please see HPI for pertinent positives and negatives. A complete 10 system review of systems are otherwise negative.  Past medical history: Depression History reviewed. No pertinent surgical history.  Social History:  reports that he has quit smoking. His smoking use included cigarettes. He has never used smokeless tobacco. He reports current alcohol use. No history on file for drug use.   Allergies  Allergen Reactions   Simvastatin Other (See Comments)    Per PT joint pain and muscle pain    History reviewed. No pertinent family history.   Prior to Admission medications   Medication Sig Start Date End Date Taking? Authorizing Provider  aspirin 81 MG tablet Take 81 mg by mouth daily.    [provider]  escitalopram (LEXAPRO) 5 MG tablet Take 5 mg by mouth daily.    [provider]  ibuprofen (ADVIL) 200 MG tablet Take 400 mg by mouth as needed for mild pain or moderate pain.    [provider]  Turmeric (CURCUMIN 95 PO) Take by mouth.    [provider]    Physical Exam: BP (!) 162/75   Pulse 60   Temp 98.3 F (36.8 C) (Oral)   Resp 17   Ht 5\' 8"  (1.727 m)   Wt 77.1 kg   SpO2 97%   BMI 25.85 kg/m   General:  Alert, oriented, calm, in no acute distress  Eyes: EOMI, clear conjuctivae, white sclerea Neck: supple, no masses, trachea mildline  Cardiovascular: RRR, no murmurs or rubs, no peripheral edema  Respiratory: clear to auscultation bilaterally, no wheezes, no crackles  Abdomen: soft, nontender, slightly distended, normal bowel tones heard  Skin: dry, no rashes  Musculoskeletal: no joint effusions, normal range of motion  Psychiatric: appropriate affect, normal speech  Neurologic: extraocular muscles intact, clear speech, moving all extremities with intact sensorium          Labs on Admission:  Basic Metabolic Panel: Recent Labs  Lab 09/05/22 1313  NA 135  K 3.9  CL 102  CO2 25  GLUCOSE 81  BUN 30*  CREATININE 0.72  CALCIUM 8.6*   Liver Function Tests: Recent Labs  Lab 09/05/22 1313  AST 15  ALT 17  ALKPHOS 42  BILITOT 0.7  PROT 5.9*  ALBUMIN 3.4*   No results for input(s): "LIPASE", "AMYLASE" in the last  168 hours. No results for input(s): "AMMONIA" in the last 168 hours. CBC: Recent Labs  Lab 09/05/22 1313  WBC 5.7  HGB 13.4  HCT 42.2  MCV 93.2  PLT 222   Cardiac Enzymes: No results for input(s): "CKTOTAL", "CKMB", "CKMBINDEX", "TROPONINI" in the last 168 hours.  BNP (last 3 results) No results for input(s): "BNP" in the last 8760 hours.  ProBNP (last 3 results) No results for input(s): "PROBNP" in the last 8760 hours.  CBG: No results for input(s): "GLUCAP" in the last 168 hours.  Radiological Exams on Admission: No results found.  Assessment/Plan This is a pleasant 77 year old gentleman  with a history of depression being admitted to the hospital with bright red blood per rectum likely as a result of lower GI bleeding.  Bright red blood per rectum-as result of lower GI bleeding, differential includes AVM, diverticular bleed.  Less likely colonic mass, colitis, etc. -Inpatient admission -N.p.o. -Avoid blood thinners (hold aspirin) -[facility] was consulted by EDP, they will consult -Follow-up every 8 hours hemoglobin  Deression-Lexapro  DVT prophylaxis: SCDs    Code Status: Full Code  Consults called: None  Admission status: The appropriate patient status for this patient is INPATIENT. Inpatient status is judged to be reasonable and necessary in order to provide the required intensity of service to ensure the patient's safety. The patient's presenting symptoms, physical exam findings, and initial radiographic and laboratory data in the context of their chronic comorbidities is felt to place them at high risk for further clinical deterioration. Furthermore, it is not anticipated that the patient will be medically stable for discharge from the hospital within 2 midnights of admission.    I certify that at the point of admission it is my clinical judgment that the patient will require inpatient hospital care spanning beyond 2 midnights from the point of admission due to high intensity of service, high risk for further deterioration and high frequency of surveillance required  Time spent: 56 minutes  [name] Sharlette Dense MD [facility] Pager [phone]  If 7PM-7AM, please contact night-coverage [email or web address] Password [facility]  09/05/2022, 4:59 PM

## 2022-09-05 NOTE — ED Triage Notes (Addendum)
Pt noticed blood in his bowel movements yesterday after dinner. Pt noted that it was bright red blood. Pt states that he has hx had hemorrhoids, and diverticulitis. Denies any pain, LOC or dizziness.  Pt states that he take one baby aspirin daily.

## 2022-09-05 NOTE — ED Notes (Signed)
ED TO INPATIENT HANDOFF REPORT  ED Nurse Name and Phone #: Crist Infante, RN [phone]  S Name/Age/Gender Lerry Liner 77 y.o. male Room/Bed: WA18/WA18  Code Status   Code Status: Not on file  Home/SNF/Other Home Patient oriented to: self, place, time, and situation Is this baseline? Yes   Triage Complete: Triage complete  Chief Complaint Blood in Stool  Triage Note Pt noticed blood in his bowel movements yesterday after dinner. Pt noted that it was bright red blood. Pt states that he has hx had hemorrhoids, and diverticulitis. Denies any pain, LOC or dizziness.  Pt states that he take one baby aspirin daily.   Allergies Allergies  Allergen Reactions   Simvastatin Other (See Comments)    Per PT joint pain and muscle pain    Level of Care/Admitting Diagnosis ED Disposition     ED Disposition  Admit   Condition  --   Comment  The patient appears reasonably stabilized for admission considering the current resources, flow, and capabilities available in the ED at this time, and I doubt any other [facility] requiring further screening and/or treatment in the ED prior to admission is  present.          B Medical/Surgery History History reviewed. No pertinent past medical history. History reviewed. No pertinent surgical history.   A IV Location/Drains/Wounds Patient Lines/Drains/Airways Status     Active Line/Drains/Airways     Name Placement date Placement time Site Days   Peripheral IV 09/05/22 20 G 1" Left Antecubital 09/05/22  1449  Antecubital  less than 1            Intake/Output Last 24 hours No intake or output data in the 24 hours ending 09/05/22 1610  Labs/Imaging Results for orders placed or performed during the hospital encounter of 09/05/22 (from the past 48 hour(s))  Comprehensive metabolic panel     Status: Abnormal   Collection Time: 09/05/22  1:13 PM  Result Value Ref Range   Sodium 135 135 - 145 mmol/L   Potassium 3.9 3.5 - 5.1 mmol/L    Chloride 102 98 - 111 mmol/L   CO2 25 22 - 32 mmol/L   Glucose, Bld 81 70 - 99 mg/dL    Comment: Glucose reference range applies only to samples taken after fasting for at least 8 hours.   BUN 30 (H) 8 - 23 mg/dL   Creatinine, Ser 1.02 0.61 - 1.24 mg/dL   Calcium 8.6 (L) 8.9 - 10.3 mg/dL   Total Protein 5.9 (L) 6.5 - 8.1 g/dL   Albumin 3.4 (L) 3.5 - 5.0 g/dL   AST 15 15 - 41 U/L   ALT 17 0 - 44 U/L   Alkaline Phosphatase 42 38 - 126 U/L   Total Bilirubin 0.7 0.3 - 1.2 mg/dL   GFR, Estimated >72 >53 mL/min    Comment: (NOTE) Calculated using the CKD-EPI Creatinine Equation (2021)    Anion gap 8 5 - 15    Comment: Performed at [facility], [address] [place], [place] [address]  CBC     Status: None   Collection Time: 09/05/22  1:13 PM  Result Value Ref Range   WBC 5.7 4.0 - 10.5 K/uL   RBC 4.53 4.22 - 5.81 MIL/uL   Hemoglobin 13.4 13.0 - 17.0 g/dL   HCT 34.7 42.5 - 95.6 %   MCV 93.2 80.0 - 100.0 fL   MCH 29.6 26.0 - 34.0 pg   MCHC 31.8 30.0 - 36.0  g/dL   RDW 66.4 40.3 - 47.4 %   Platelets 222 150 - 400 K/uL   nRBC 0.0 0.0 - 0.2 %    Comment: Performed at [facility], [address], [place], [place] [address]  Type and screen [facility]     Status: None   Collection Time: 09/05/22  1:13 PM  Result Value Ref Range   ABO/RH(D) O POS    Antibody Screen NEG    Sample Expiration      09/08/2022,2359 Performed at [facility], [address] [place], [place] [address]   ABO/Rh     Status: None   Collection Time: 09/05/22  2:20 PM  Result Value Ref Range   ABO/RH(D)      O POS Performed at [facility], [address] [place], [place] [address]   POC occult blood, ED     Status: Abnormal   Collection Time: 09/05/22  3:19 PM  Result Value Ref Range   Fecal Occult Bld POSITIVE (A) NEGATIVE   No results found.  Pending Labs Unresulted Labs (From admission, onward)     None       Vitals/Pain Today's Vitals   09/05/22 1233 09/05/22 1258 09/05/22 1259 09/05/22 1530  BP: 139/66   (!) 162/75  Pulse: 69   60  Resp: 17   17  Temp: 98.3 F (36.8 C)     TempSrc: Oral     SpO2: 96%   97%  Weight:   77.1 kg   Height:   5\' 8"  (1.727 m)   PainSc:  0-No pain      Isolation Precautions No active isolations  Medications Medications - No data to display  Mobility walks     Focused Assessments Neuro Assessment Handoff:  Swallow screen pass?  N/A         Neuro Assessment: Within Defined Limits Neuro Checks:      Has TPA been given? No If patient is a Neuro Trauma and patient is going to OR before floor call report to 4N Charge nurse: [phone] or [phone]   R Recommendations: See Admitting Provider Note  Report given to:   Additional Notes: N/A

## 2022-09-06 DIAGNOSIS — K922 Gastrointestinal hemorrhage, unspecified: Secondary | ICD-10-CM | POA: Diagnosis not present

## 2022-09-06 DIAGNOSIS — K625 Hemorrhage of anus and rectum: Secondary | ICD-10-CM | POA: Diagnosis not present

## 2022-09-06 LAB — CBC
HCT: 36.3 % — ABNORMAL LOW (ref 39.0–52.0)
Hemoglobin: 11.6 g/dL — ABNORMAL LOW (ref 13.0–17.0)
MCH: 29.5 pg (ref 26.0–34.0)
MCHC: 32 g/dL (ref 30.0–36.0)
MCV: 92.4 fL (ref 80.0–100.0)
Platelets: 196 10*3/uL (ref 150–400)
RBC: 3.93 MIL/uL — ABNORMAL LOW (ref 4.22–5.81)
RDW: 13 % (ref 11.5–15.5)
WBC: 5.1 10*3/uL (ref 4.0–10.5)
nRBC: 0 % (ref 0.0–0.2)

## 2022-09-06 LAB — BASIC METABOLIC PANEL
Anion gap: 7 (ref 5–15)
BUN: 26 mg/dL — ABNORMAL HIGH (ref 8–23)
CO2: 26 mmol/L (ref 22–32)
Calcium: 8.3 mg/dL — ABNORMAL LOW (ref 8.9–10.3)
Chloride: 104 mmol/L (ref 98–111)
Creatinine, Ser: 0.78 mg/dL (ref 0.61–1.24)
GFR, Estimated: >60 mL/min (ref >60)
Glucose, Bld: 85 mg/dL (ref 70–99)
Potassium: 3.6 mmol/L (ref 3.5–5.1)
Sodium: 137 mmol/L (ref 135–145)

## 2022-09-06 LAB — HEMOGLOBIN AND HEMATOCRIT, BLOOD
HCT: 39.7 % (ref 39.0–52.0)
Hemoglobin: 12.9 g/dL — ABNORMAL LOW (ref 13.0–17.0)

## 2022-09-06 NOTE — Care Management Obs Status (Signed)
MEDICARE OBSERVATION STATUS NOTIFICATION   Patient Details  Name: Kyle Williamson MRN: [id number] Date of Birth: Oct 27, 1945   Medicare Observation Status Notification Given:  Yes    Larrie Kass, LCSW 09/06/2022, 1:39 PM

## 2022-09-06 NOTE — Care Management CC44 (Signed)
Condition Code 44 Documentation Completed  Patient Details  Name: Kyle Williamson MRN: [id number] Date of Birth: 09-20-1945   Condition Code 44 given:  Yes Patient signature on Condition Code 44 notice:  Yes Documentation of 2 MD's agreement:  Yes Code 44 added to claim:  Yes    Larrie Kass, LCSW 09/06/2022, 1:39 PM

## 2022-09-06 NOTE — Consult Note (Signed)
Referring Provider: Dr. Jerral Ralph Primary Care Physician:  Patient, No Pcp Per Primary Gastroenterologist:  Dr. Dulce Sellar  Reason for Consultation:  GI Bleeding  HPI: Kyle Williamson is a 77 y.o. male with acute onset of bright red blood per rectum 2 days ago that occurred 4 times and then 2 times yesterday. Had loose stools with the rectal bleeding. Denies any associated abdominal pain. Had a BM this morning and left in toilet and it is dark brown stool with small amount of red blood around the stool. Reports blood turned toilet water red prior to admit. Denies associated dizziness, N/V. Colonoscopy June 2022 showed multiple sigmoid, descending colon and transverse colon diverticulosis. Eager to go home today to help with his wife who he says has dementia and who was recently hospitalized.  History reviewed. No pertinent past medical history.  History reviewed. No pertinent surgical history.  Prior to Admission medications   Medication Sig Start Date End Date Taking? Authorizing Provider  Ascorbic Acid (VITAMIN C ER PO) Take 1 tablet by mouth daily.   Yes [provider]  aspirin 81 MG tablet Take 81 mg by mouth daily.   Yes [provider]  escitalopram (LEXAPRO) 5 MG tablet Take 5 mg by mouth daily.   Yes [provider]  ibuprofen (ADVIL) 200 MG tablet Take 400 mg by mouth as needed for mild pain or moderate pain.   Yes [provider]  Turmeric (CURCUMIN 95 PO) Take by mouth.   Yes [provider]  VITAMIN D PO Take 1 tablet by mouth daily.   Yes [provider]    Scheduled Meds: Continuous Infusions: PRN Meds:.acetaminophen **OR** acetaminophen, albuterol, ondansetron **OR** ondansetron (ZOFRAN) IV, traZODone  Allergies as of 09/05/2022 - Review Complete 09/05/2022  Allergen Reaction Noted   Simvastatin Other (See Comments) 07/05/2016    History reviewed. No pertinent family history.  Social History   Socioeconomic History    Marital status: Divorced    Spouse name: Not on file   Number of children: Not on file   Years of education: Not on file   Highest education level: Not on file  Occupational History   Not on file  Tobacco Use   Smoking status: Former    Types: Cigarettes   Smokeless tobacco: Never  Substance and Sexual Activity   Alcohol use: Yes    Comment: occ 2-3x a week   Drug use: Not on file   Sexual activity: Not on file  Other Topics Concern   Not on file  Social History Narrative   Not on file   Social Determinants of Health   Financial Resource Strain: Not on file  Food Insecurity: No Food Insecurity (09/05/2022)   Hunger Vital Sign    Worried About Running Out of Food in the Last Year: Never true    Ran Out of Food in the Last Year: Never true  Transportation Needs: No Transportation Needs (09/05/2022)   PRAPARE - Administrator, Civil Service (Medical): No    Lack of Transportation (Non-Medical): No  Physical Activity: Not on file  Stress: Not on file  Social Connections: Not on file  Intimate Partner Violence: Not At Risk (09/05/2022)   Humiliation, Afraid, Rape, and Kick questionnaire    Fear of Current or Ex-Partner: No    Emotionally Abused: No    Physically Abused: No    Sexually Abused: No    Review of Systems: All negative except as stated above in HPI.  Physical Exam: Vital signs: Vitals:   09/06/22 0156 09/06/22 0538  BP: 139/79 (!) 149/70  Pulse: (!) 56 (!) 51  Resp: 20 (!) 21  Temp: 97.7 F (36.5 C) 97.9 F (36.6 C)  SpO2: 98% 95%   Last BM Date : 09/05/22 General:   Alert,  Well-developed, well-nourished, pleasant and cooperative in NAD, elderly Head: normocephalic, atraumatic Eyes: anicteric sclera ENT: oropharynx clear Neck: supple, nontender Lungs:  Clear throughout to auscultation.   No wheezes, crackles, or rhonchi. No acute distress. Heart:  Regular rate and rhythm; no murmurs, clicks, rubs,  or gallops. Abdomen: soft, nontender,  nondistended, +BS  Rectal:  Deferred Ext: no edema  GI:  Lab Results: Recent Labs    09/05/22 1313 09/05/22 1710 09/06/22 0058 09/06/22 0900  WBC 5.7  --  5.1  --   HGB 13.4 11.5* 11.6* 12.9*  HCT 42.2 36.0* 36.3* 39.7  PLT 222  --  196  --    BMET Recent Labs    09/05/22 1313 09/06/22 0058  NA 135 137  K 3.9 3.6  CL 102 104  CO2 25 26  GLUCOSE 81 85  BUN 30* 26*  CREATININE 0.72 0.78  CALCIUM 8.6* 8.3*   LFT Recent Labs    09/05/22 1313  PROT 5.9*  ALBUMIN 3.4*  AST 15  ALT 17  ALKPHOS 42  BILITOT 0.7   PT/INR No results for input(s): "LABPROT", "INR" in the last 72 hours.   Studies/Results: No results found.  Impression/Plan: Painless hematochezia likely due to diverticular bleeding that seems to be resolving. Hgb 12.9. Advance diet. I do not think a repeat colonoscopy is needed as an outpt based on this episode since colonoscopy done 2 years ago. Ok to go home this afternoon from GI standpoint. F/U with Dr. Dulce Sellar in 4-6 weeks. Will sign off. Call if questions.     LOS: 1 day   Shirley Friar  09/06/2022, 10:53 AM  Questions please call [phone]

## 2022-09-06 NOTE — Progress Notes (Signed)
Pt states no bloody bm's overnight.

## 2022-09-06 NOTE — Discharge Summary (Signed)
Physician Discharge Summary  Kyle Williamson ZOX:[id number] DOB: 06/11/1945 DOA: 09/05/2022  PCP: Patient, No Pcp Per  Admit date: 09/05/2022 Discharge date: 09/06/2022  Admitted From: Home Disposition: Home  Recommendations for Outpatient Follow-up:  Follow up with PCP in 1-2 weeks Please obtain BMP/CBC in one week GI clinic to schedule follow-up  Home Health: N/A Equipment/Devices: N/A  Discharge Condition: Stable CODE STATUS: Full code Diet recommendation: Regular diet, bowel regimen  Discharge summary: 77 year old with history of depression, previous diverticulitis presented with 1 episode of loose bloody bowel movement, multiple episodes with darker blood.  Hemoglobin remained stable.  He did not show any evidence of further bleeding.  He was monitored overnight in the hospital.  Seen by gastroenterology.  Since active bleeding was not present and patient had to go home to take care of his demented wife, the determination was made to discharge him home with outpatient follow-up.  GI clinic to schedule follow-up.  Will benefit with checking the CBC in 1 week.  Patient will report back for any recurrent symptoms or heavy bleeding.  Discharge Diagnoses:  Principal Problem:   Lower GI bleed    Discharge Instructions  Discharge Instructions     Diet general   Complete by: As directed    Discharge instructions   Complete by: As directed    Avoid constipation.  Use laxatives as needed.  Report to GI office for any recurrent bleeding.   Increase activity slowly   Complete by: As directed       Allergies as of 09/06/2022       Reactions   Simvastatin Other (See Comments)   Per PT joint pain and muscle pain        Medication List     TAKE these medications    aspirin 81 MG tablet Take 81 mg by mouth daily.   CURCUMIN 95 PO Take by mouth.   escitalopram 5 MG tablet Commonly known as: LEXAPRO Take 5 mg by mouth daily.   ibuprofen 200 MG tablet Commonly  known as: ADVIL Take 400 mg by mouth as needed for mild pain or moderate pain.   VITAMIN C ER PO Take 1 tablet by mouth daily.   VITAMIN D PO Take 1 tablet by mouth daily.        Allergies  Allergen Reactions   Simvastatin Other (See Comments)    Per PT joint pain and muscle pain    Consultations: Gastroenterology   Procedures/Studies: No results found. (Echo, Carotid, EGD, Colonoscopy, ERCP)    Subjective: Patient seen in the morning rounds.  Denies any complaints.  He is eager to go home.   Discharge Exam: Vitals:   09/06/22 0156 09/06/22 0538  BP: 139/79 (!) 149/70  Pulse: (!) 56 (!) 51  Resp: 20 (!) 21  Temp: 97.7 F (36.5 C) 97.9 F (36.6 C)  SpO2: 98% 95%   Vitals:   09/05/22 1747 09/05/22 2131 09/06/22 0156 09/06/22 0538  BP: (!) 158/74 (!) 171/78 139/79 (!) 149/70  Pulse: (!) 55 (!) 52 (!) 56 (!) 51  Resp: 20 18 20  (!) 21  Temp: 98.3 F (36.8 C) 97.9 F (36.6 C) 97.7 F (36.5 C) 97.9 F (36.6 C)  TempSrc: Oral Oral Oral Oral  SpO2: 99% 100% 98% 95%  Weight:      Height:        General: Pt is alert, awake, not in acute distress Cardiovascular: RRR, S1/S2 +, no rubs, no gallops Respiratory: CTA bilaterally, no wheezing, no  rhonchi Abdominal: Soft, NT, ND, bowel sounds + Extremities: no edema, no cyanosis    The results of significant diagnostics from this hospitalization (including imaging, microbiology, ancillary and laboratory) are listed below for reference.     Microbiology: No results found for this or any previous visit (from the past 240 hour(s)).   Labs: BNP (last 3 results) No results for input(s): "BNP" in the last 8760 hours. Basic Metabolic Panel: Recent Labs  Lab 09/05/22 1313 09/06/22 0058  NA 135 137  K 3.9 3.6  CL 102 104  CO2 25 26  GLUCOSE 81 85  BUN 30* 26*  CREATININE 0.72 0.78  CALCIUM 8.6* 8.3*   Liver Function Tests: Recent Labs  Lab 09/05/22 1313  AST 15  ALT 17  ALKPHOS 42  BILITOT 0.7   PROT 5.9*  ALBUMIN 3.4*   No results for input(s): "LIPASE", "AMYLASE" in the last 168 hours. No results for input(s): "AMMONIA" in the last 168 hours. CBC: Recent Labs  Lab 09/05/22 1313 09/05/22 1710 09/06/22 0058 09/06/22 0900  WBC 5.7  --  5.1  --   HGB 13.4 11.5* 11.6* 12.9*  HCT 42.2 36.0* 36.3* 39.7  MCV 93.2  --  92.4  --   PLT 222  --  196  --    Cardiac Enzymes: No results for input(s): "CKTOTAL", "CKMB", "CKMBINDEX", "TROPONINI" in the last 168 hours. BNP: Invalid input(s): "POCBNP" CBG: No results for input(s): "GLUCAP" in the last 168 hours. D-Dimer No results for input(s): "DDIMER" in the last 72 hours. Hgb A1c No results for input(s): "HGBA1C" in the last 72 hours. Lipid Profile No results for input(s): "CHOL", "HDL", "LDLCALC", "TRIG", "CHOLHDL", "LDLDIRECT" in the last 72 hours. Thyroid function studies No results for input(s): "TSH", "T4TOTAL", "T3FREE", "THYROIDAB" in the last 72 hours.  Invalid input(s): "FREET3" Anemia work up No results for input(s): "VITAMINB12", "FOLATE", "FERRITIN", "TIBC", "IRON", "RETICCTPCT" in the last 72 hours. Urinalysis No results found for: "COLORURINE", "APPEARANCEUR", "LABSPEC", "PHURINE", "GLUCOSEU", "HGBUR", "BILIRUBINUR", "KETONESUR", "PROTEINUR", "UROBILINOGEN", "NITRITE", "LEUKOCYTESUR" Sepsis Labs Recent Labs  Lab 09/05/22 1313 09/06/22 0058  WBC 5.7 5.1   Microbiology No results found for this or any previous visit (from the past 240 hour(s)).   Time coordinating discharge: 28 minutes  SIGNED:   Dorcas Carrow, MD  [facility] 09/06/2022, 12:22 PM

## 2022-09-10 DIAGNOSIS — E782 Mixed hyperlipidemia: Secondary | ICD-10-CM | POA: Diagnosis not present

## 2022-09-10 DIAGNOSIS — Z8719 Personal history of other diseases of the digestive system: Secondary | ICD-10-CM | POA: Diagnosis not present

## 2022-09-10 DIAGNOSIS — Z9181 History of falling: Secondary | ICD-10-CM | POA: Diagnosis not present

## 2022-09-10 DIAGNOSIS — Z0000 Encounter for general adult medical examination without abnormal findings: Secondary | ICD-10-CM | POA: Diagnosis not present

## 2022-09-10 DIAGNOSIS — I700 Atherosclerosis of aorta: Secondary | ICD-10-CM | POA: Diagnosis not present

## 2022-09-10 DIAGNOSIS — F411 Generalized anxiety disorder: Secondary | ICD-10-CM | POA: Diagnosis not present

## 2022-09-10 DIAGNOSIS — E559 Vitamin D deficiency, unspecified: Secondary | ICD-10-CM | POA: Diagnosis not present

## 2022-09-16 ENCOUNTER — Encounter (HOSPITAL_COMMUNITY): Payer: Self-pay | Admitting: Emergency Medicine

## 2022-09-16 ENCOUNTER — Emergency Department (HOSPITAL_COMMUNITY)
Admission: EM | Admit: 2022-09-16 | Discharge: 2022-09-16 | Disposition: A | Discharge status: Home / Self Care | Payer: Medicare PPO | Source: Home / Self Care | Attending: Emergency Medicine | Admitting: Emergency Medicine

## 2022-09-16 ENCOUNTER — Other Ambulatory Visit: Payer: Self-pay

## 2022-09-16 ENCOUNTER — Emergency Department (HOSPITAL_COMMUNITY): Payer: Medicare PPO

## 2022-09-16 DIAGNOSIS — S199XXA Unspecified injury of neck, initial encounter: Secondary | ICD-10-CM | POA: Diagnosis not present

## 2022-09-16 DIAGNOSIS — Z7982 Long term (current) use of aspirin: Secondary | ICD-10-CM | POA: Diagnosis not present

## 2022-09-16 DIAGNOSIS — W06XXXA Fall from bed, initial encounter: Secondary | ICD-10-CM | POA: Insufficient documentation

## 2022-09-16 DIAGNOSIS — S0990XA Unspecified injury of head, initial encounter: Secondary | ICD-10-CM | POA: Diagnosis not present

## 2022-09-16 DIAGNOSIS — W19XXXA Unspecified fall, initial encounter: Secondary | ICD-10-CM | POA: Diagnosis not present

## 2022-09-16 DIAGNOSIS — S1093XA Contusion of unspecified part of neck, initial encounter: Secondary | ICD-10-CM | POA: Diagnosis not present

## 2022-09-16 DIAGNOSIS — R519 Headache, unspecified: Secondary | ICD-10-CM | POA: Diagnosis not present

## 2022-09-16 DIAGNOSIS — I10 Essential (primary) hypertension: Secondary | ICD-10-CM | POA: Diagnosis not present

## 2022-09-16 DIAGNOSIS — M542 Cervicalgia: Secondary | ICD-10-CM | POA: Diagnosis not present

## 2022-09-16 MED ORDER — LIDOCAINE 5 % EX PTCH: 1.0000 | MEDICATED_PATCH | CUTANEOUS | 0 refills | Status: DC

## 2022-09-16 MED ORDER — OXYCODONE HCL 5 MG PO TABS
5.0000 mg | ORAL_TABLET | Freq: Once | ORAL | Status: AC
  Administered 2022-09-16: 5 mg via ORAL
  Filled 2022-09-16: qty 1

## 2022-09-16 MED ORDER — LIDOCAINE 5 % EX PTCH: 1.0000 | MEDICATED_PATCH | CUTANEOUS | 0 refills | Status: AC

## 2022-09-16 MED ORDER — ACETAMINOPHEN 500 MG PO TABS
1000.0000 mg | ORAL_TABLET | Freq: Once | ORAL | Status: AC
  Administered 2022-09-16: 1000 mg via ORAL
  Filled 2022-09-16: qty 2

## 2022-09-16 MED ORDER — LIDOCAINE 5 % EX PTCH
1.0000 | MEDICATED_PATCH | Freq: Once | CUTANEOUS | Status: DC
  Administered 2022-09-16: 3 via TRANSDERMAL
  Filled 2022-09-16: qty 3

## 2022-09-16 NOTE — ED Triage Notes (Signed)
Patient BIB EMS for evaluation of neck pain.  Reports falling out of bed this morning.  C-spine cleared prior to arrival.

## 2022-09-16 NOTE — ED Provider Notes (Signed)
[place] EMERGENCY DEPARTMENT AT [facility] Provider Note   CSN: [id number] Arrival date & time: 09/16/22  4098     History  Chief Complaint  Patient presents with   Neck Injury    Kyle Williamson is a 77 y.o. male.  Patient is a 77 year old male with no significant past medical history presenting to the emergency department with head and neck pain.  Patient reports around 4 AM this morning he accidentally rolled out of bed in his sleep and hit the back of his head and neck on a dresser.  He denies loss of consciousness.  He states that he is having a posterior headache and lower neck/upper back pain.  He denies any numbness or weakness.  He denies any nausea, vomiting or vision changes.  He states that the pain is radiating into his shoulders and arms.  He denies any blood thinner use.  He states he did take Motrin prior to arrival.  The history is provided by the patient.  Neck Injury       Home Medications Prior to Admission medications   Medication Sig Start Date End Date Taking? Authorizing Provider  lidocaine (LIDODERM) 5 % Place 1 patch onto the skin daily. Remove & Discard patch within 12 hours or as directed by MD 09/16/22  Yes Theresia Lo, Cecile Sheerer, DO  Ascorbic Acid (VITAMIN C ER PO) Take 1 tablet by mouth daily.    [provider]  aspirin 81 MG tablet Take 81 mg by mouth daily.    [provider]  escitalopram (LEXAPRO) 5 MG tablet Take 5 mg by mouth daily.    [provider]  ibuprofen (ADVIL) 200 MG tablet Take 400 mg by mouth as needed for mild pain or moderate pain.    [provider]  Turmeric (CURCUMIN 95 PO) Take by mouth.    [provider]  VITAMIN D PO Take 1 tablet by mouth daily.    [provider]      Allergies    Simvastatin    Review of Systems   Review of Systems  Physical Exam Updated Vital Signs BP (!) 161/77 (BP Location: Left Arm)   Pulse 65   Temp 97.7 F (36.5 C)  (Oral)   Resp 18   SpO2 97%  Physical Exam Vitals and nursing note reviewed.  Constitutional:      General: He is not in acute distress.    Appearance: Normal appearance.  HENT:     Head: Normocephalic and atraumatic.     Nose: Nose normal.     Mouth/Throat:     Mouth: Mucous membranes are moist.     Pharynx: Oropharynx is clear.  Eyes:     Extraocular Movements: Extraocular movements intact.     Conjunctiva/sclera: Conjunctivae normal.  Neck:     Comments: No midline neck tenderness, bilateral cervical paraspinal muscle tenderness and proximal thoracic paraspinal muscle tenderness Cardiovascular:     Rate and Rhythm: Normal rate and regular rhythm.     Heart sounds: Normal heart sounds.  Pulmonary:     Effort: Pulmonary effort is normal.     Breath sounds: Normal breath sounds.  Abdominal:     General: Abdomen is flat.     Palpations: Abdomen is soft.     Tenderness: There is no abdominal tenderness.  Musculoskeletal:     Cervical back: Normal range of motion and neck supple.     Comments: Superior thoracic paraspinal muscle tenderness, no lumbar midline  tenderness No bony tenderness of bilateral upper or lower extremities  Skin:    General: Skin is warm and dry.     Findings: No bruising.  Neurological:     General: No focal deficit present.     Mental Status: He is alert and oriented to person, place, and time.     Sensory: No sensory deficit.     Motor: No weakness (5 out of 5 strength in bilateral upper and lower extremities).  Psychiatric:        Mood and Affect: Mood normal.        Behavior: Behavior normal.     ED Results / Procedures / Treatments   Labs (all labs ordered are listed, but only abnormal results are displayed) Labs Reviewed - No data to display  EKG None  Radiology CT Head Wo Contrast  Result Date: 09/16/2022 CLINICAL DATA:  Fall from bed with neck trauma EXAM: CT HEAD WITHOUT CONTRAST CT CERVICAL SPINE WITHOUT CONTRAST TECHNIQUE:  Multidetector CT imaging of the head and cervical spine was performed following the standard protocol without intravenous contrast. Multiplanar CT image reconstructions of the cervical spine were also generated. RADIATION DOSE REDUCTION: This exam was performed according to the departmental dose-optimization program which includes automated exposure control, adjustment of the mA and/or kV according to patient size and/or use of iterative reconstruction technique. COMPARISON:  None Available. FINDINGS: CT HEAD FINDINGS Brain: No evidence of acute infarction, hemorrhage, hydrocephalus, extra-axial collection or mass lesion/mass effect. Vascular: No hyperdense vessel or unexpected calcification. Skull: Normal. Negative for fracture or focal lesion. Sinuses/Orbits: No acute finding. CT CERVICAL SPINE FINDINGS Alignment: No traumatic malalignment Skull base and vertebrae: No acute fracture. No primary bone lesion or focal pathologic process. Soft tissues and spinal canal: No prevertebral fluid or swelling. No visible canal hematoma. Disc levels:  Ordinary degenerative endplate and facet spurring. Upper chest: Negative IMPRESSION: No evidence of acute intracranial or cervical spine injury. Electronically Signed   By: Tiburcio Pea M.D.   On: 09/16/2022 10:08   CT Cervical Spine Wo Contrast  Result Date: 09/16/2022 CLINICAL DATA:  Fall from bed with neck trauma EXAM: CT HEAD WITHOUT CONTRAST CT CERVICAL SPINE WITHOUT CONTRAST TECHNIQUE: Multidetector CT imaging of the head and cervical spine was performed following the standard protocol without intravenous contrast. Multiplanar CT image reconstructions of the cervical spine were also generated. RADIATION DOSE REDUCTION: This exam was performed according to the departmental dose-optimization program which includes automated exposure control, adjustment of the mA and/or kV according to patient size and/or use of iterative reconstruction technique. COMPARISON:  None  Available. FINDINGS: CT HEAD FINDINGS Brain: No evidence of acute infarction, hemorrhage, hydrocephalus, extra-axial collection or mass lesion/mass effect. Vascular: No hyperdense vessel or unexpected calcification. Skull: Normal. Negative for fracture or focal lesion. Sinuses/Orbits: No acute finding. CT CERVICAL SPINE FINDINGS Alignment: No traumatic malalignment Skull base and vertebrae: No acute fracture. No primary bone lesion or focal pathologic process. Soft tissues and spinal canal: No prevertebral fluid or swelling. No visible canal hematoma. Disc levels:  Ordinary degenerative endplate and facet spurring. Upper chest: Negative IMPRESSION: No evidence of acute intracranial or cervical spine injury. Electronically Signed   By: Tiburcio Pea M.D.   On: 09/16/2022 10:08    Procedures Procedures    Medications Ordered in ED Medications  lidocaine (LIDODERM) 5 % 1-3 patch (3 patches Transdermal Patch Applied 09/16/22 0910)  oxyCODONE (Oxy IR/ROXICODONE) immediate release tablet 5 mg (has no administration in time range)  acetaminophen (  TYLENOL) tablet 1,000 mg (1,000 mg Oral Given 09/16/22 1610)    ED Course/ Medical Decision Making/ A&P Clinical Course as of 09/16/22 1132  Sun Sep 16, 2022  1037 CT without acute traumatic injury. He is stable for discharge with outpatient follow up. [VK]    Clinical Course User Index [VK] Rexford Maus, DO                                 Medical Decision Making This patient presents to the ED with chief complaint(s) of fall with no pertinent past medical history which further complicates the presenting complaint. The complaint involves an extensive differential diagnosis and also carries with it a high risk of complications and morbidity.    The differential diagnosis includes due to patient's age concern for ICH, mass effect, cervical spine fracture, contusion, muscle strain or spasm, no neurologic deficits making spinal cord injury  unlikely  Additional history obtained: Additional history obtained from N/A Records reviewed N/A  ED Course and Reassessment: Patient's arrival he is hemodynamically stable in no acute distress.  Due to patient's age and fall he will have a head CT and C-spine.  He will be given Tylenol and lidocaine patch for pain and will be closely reassessed.  Independent labs interpretation:  N/  Independent visualization of imaging: - I independently visualized the following imaging with scope of interpretation limited to determining acute life threatening conditions related to emergency care: CTH/C-spine, which revealed no acute traumatic injury  Consultation: - Consulted or discussed management/test interpretation w/ external professional: N/A  Consideration for admission or further workup: Patient has no emergent conditions requiring admission or further work-up at this time and is stable for discharge home with primary care follow-up  Social Determinants of health: N/A    Amount and/or Complexity of Data Reviewed Radiology: ordered.  Risk OTC drugs. Prescription drug management.          Final Clinical Impression(s) / ED Diagnoses Final diagnoses:  Fall from bed, initial encounter  Contusion of neck, initial encounter    Rx / DC Orders ED Discharge Orders          Ordered    lidocaine (LIDODERM) 5 %  Every 24 hours        09/16/22 1132              Rexford Maus, DO 09/16/22 1132

## 2022-09-16 NOTE — Discharge Instructions (Addendum)
You were seen in the emergency department for your headache and neck pain after your fall.  Your workup showed no signs of broken bones or bleeding in your brain.  You likely bruised the back of your head and neck or strained your muscles.  You can take Tylenol and Motrin as needed for pain and both can be taken up to every 6 hours.  You can use lidocaine patches, ice or heat.  You should try to stretch her muscles and get back to her normal activities as soon as you can to prevent your muscles from getting stiff.  You can follow-up with your primary doctor to have your symptoms rechecked.  You should return to the emergency department if you develop numbness or weakness in your arms, if significantly worsening pain or any other new or concerning symptoms.

## 2022-09-19 DIAGNOSIS — M542 Cervicalgia: Secondary | ICD-10-CM | POA: Diagnosis not present

## 2022-09-19 DIAGNOSIS — S199XXD Unspecified injury of neck, subsequent encounter: Secondary | ICD-10-CM | POA: Diagnosis not present

## 2022-09-24 ENCOUNTER — Emergency Department (HOSPITAL_COMMUNITY): Payer: Medicare PPO

## 2022-09-24 ENCOUNTER — Emergency Department (HOSPITAL_COMMUNITY)
Admission: EM | Admit: 2022-09-24 | Discharge: 2022-09-24 | Disposition: A | Discharge status: Home / Self Care | Payer: Medicare PPO | Source: Home / Self Care | Attending: Emergency Medicine | Admitting: Emergency Medicine

## 2022-09-24 DIAGNOSIS — I10 Essential (primary) hypertension: Secondary | ICD-10-CM | POA: Diagnosis not present

## 2022-09-24 DIAGNOSIS — R29818 Other symptoms and signs involving the nervous system: Secondary | ICD-10-CM | POA: Diagnosis not present

## 2022-09-24 DIAGNOSIS — M4802 Spinal stenosis, cervical region: Secondary | ICD-10-CM | POA: Diagnosis not present

## 2022-09-24 DIAGNOSIS — S134XXA Sprain of ligaments of cervical spine, initial encounter: Secondary | ICD-10-CM | POA: Insufficient documentation

## 2022-09-24 DIAGNOSIS — I708 Atherosclerosis of other arteries: Secondary | ICD-10-CM | POA: Diagnosis not present

## 2022-09-24 DIAGNOSIS — M47814 Spondylosis without myelopathy or radiculopathy, thoracic region: Secondary | ICD-10-CM | POA: Diagnosis not present

## 2022-09-24 DIAGNOSIS — W06XXXA Fall from bed, initial encounter: Secondary | ICD-10-CM | POA: Diagnosis not present

## 2022-09-24 DIAGNOSIS — M47812 Spondylosis without myelopathy or radiculopathy, cervical region: Secondary | ICD-10-CM | POA: Diagnosis not present

## 2022-09-24 DIAGNOSIS — W19XXXA Unspecified fall, initial encounter: Secondary | ICD-10-CM | POA: Diagnosis not present

## 2022-09-24 DIAGNOSIS — I672 Cerebral atherosclerosis: Secondary | ICD-10-CM | POA: Diagnosis not present

## 2022-09-24 DIAGNOSIS — M542 Cervicalgia: Secondary | ICD-10-CM | POA: Diagnosis not present

## 2022-09-24 DIAGNOSIS — S199XXA Unspecified injury of neck, initial encounter: Secondary | ICD-10-CM | POA: Diagnosis not present

## 2022-09-24 DIAGNOSIS — M549 Dorsalgia, unspecified: Secondary | ICD-10-CM | POA: Diagnosis not present

## 2022-09-24 DIAGNOSIS — Z7982 Long term (current) use of aspirin: Secondary | ICD-10-CM | POA: Diagnosis not present

## 2022-09-24 LAB — COMPREHENSIVE METABOLIC PANEL
ALT: 31 U/L (ref 0–44)
AST: 25 U/L (ref 15–41)
Albumin: 3.8 g/dL (ref 3.5–5.0)
Alkaline Phosphatase: 49 U/L (ref 38–126)
Anion gap: 10 (ref 5–15)
BUN: 24 mg/dL — ABNORMAL HIGH (ref 8–23)
CO2: 26 mmol/L (ref 22–32)
Calcium: 8.9 mg/dL (ref 8.9–10.3)
Chloride: 99 mmol/L (ref 98–111)
Creatinine, Ser: 0.78 mg/dL (ref 0.61–1.24)
GFR, Estimated: >60 mL/min (ref >60)
Glucose, Bld: 105 mg/dL — ABNORMAL HIGH (ref 70–99)
Potassium: 4.5 mmol/L (ref 3.5–5.1)
Sodium: 135 mmol/L (ref 135–145)
Total Bilirubin: 1.1 mg/dL (ref 0.3–1.2)
Total Protein: 6.9 g/dL (ref 6.5–8.1)

## 2022-09-24 LAB — CBC WITH DIFFERENTIAL/PLATELET
Abs Immature Granulocytes: 0.05 10*3/uL (ref 0.00–0.07)
Basophils Absolute: 0 10*3/uL (ref 0.0–0.1)
Basophils Relative: 0 %
Eosinophils Absolute: 0 10*3/uL (ref 0.0–0.5)
Eosinophils Relative: 0 %
HCT: 45 % (ref 39.0–52.0)
Hemoglobin: 14.5 g/dL (ref 13.0–17.0)
Immature Granulocytes: 1 %
Lymphocytes Relative: 12 %
Lymphs Abs: 1 10*3/uL (ref 0.7–4.0)
MCH: 29.7 pg (ref 26.0–34.0)
MCHC: 32.2 g/dL (ref 30.0–36.0)
MCV: 92.2 fL (ref 80.0–100.0)
Monocytes Absolute: 0.6 10*3/uL (ref 0.1–1.0)
Monocytes Relative: 7 %
Neutro Abs: 7 10*3/uL (ref 1.7–7.7)
Neutrophils Relative %: 80 %
Platelets: 283 10*3/uL (ref 150–400)
RBC: 4.88 MIL/uL (ref 4.22–5.81)
RDW: 12.8 % (ref 11.5–15.5)
WBC: 8.7 10*3/uL (ref 4.0–10.5)
nRBC: 0 % (ref 0.0–0.2)

## 2022-09-24 MED ORDER — IOHEXOL 350 MG/ML SOLN
75.0000 mL | Freq: Once | INTRAVENOUS | Status: AC | PRN
  Administered 2022-09-24: 75 mL via INTRAVENOUS

## 2022-09-24 NOTE — ED Triage Notes (Signed)
Pt BIB [facility] for evaluation of intermittent slurred speech primarily with 's' sound. Pt seen for fall recently, prescribed tramadol and muscle relaxer. Pt also described strange dreams and family reports abnormal sleeping behavior. Stroke screen negative. A&Ox4. Last tramadol and muscle relaxer this morning.  192/114 HR 73 98% RA CBG 116

## 2022-09-24 NOTE — ED Notes (Signed)
[place] j collar placed. Patient and family verbalized understanding of usage.

## 2022-09-24 NOTE — Discharge Instructions (Signed)
Wear the cervical collar.  Watch for increasing numbness or weakness.  Follow-up with neurosurgery.

## 2022-09-24 NOTE — ED Provider Notes (Signed)
[place] EMERGENCY DEPARTMENT AT [facility] Provider Note   CSN: [id number] Arrival date & time: 09/24/22  1040     History  Chief Complaint  Patient presents with   Aphasia    Kyle Williamson is a 77 y.o. male.  HPI Had a fall around a week ago.  Larey Seat out of bed hit side of head.  Seen in ER.  Has had left neck pain since.  Reportedly now had and some difficulty speaking.  Some difficulty getting words out.  States he also has tingling in both his hands.  States he has seen a chiropractor twice since the fall.  Had been given muscle relaxers and tramadol.  Also states he has had some pain in his upper mid back since the fall.   No past medical history on file.  Home Medications Prior to Admission medications   Medication Sig Start Date End Date Taking? Authorizing Provider  Ascorbic Acid (VITAMIN C ER PO) Take 1 tablet by mouth daily.    [provider]  aspirin 81 MG tablet Take 81 mg by mouth daily.    [provider]  escitalopram (LEXAPRO) 5 MG tablet Take 5 mg by mouth daily.    [provider]  ibuprofen (ADVIL) 200 MG tablet Take 400 mg by mouth as needed for mild pain or moderate pain.    [provider]  lidocaine (LIDODERM) 5 % Place 1 patch onto the skin daily. Remove & Discard patch within 12 hours or as directed by MD 09/16/22   Rexford Maus, DO  Turmeric (CURCUMIN 95 PO) Take by mouth.    [provider]  VITAMIN D PO Take 1 tablet by mouth daily.    [provider]      Allergies    Simvastatin    Review of Systems   Review of Systems  Physical Exam Updated Vital Signs BP (!) 189/93   Pulse 66   Temp 97.9 F (36.6 C) (Oral)   Resp 19   SpO2 96%  Physical Exam Vitals and nursing note reviewed.  Eyes:     Extraocular Movements: Extraocular movements intact.     Pupils: Pupils are equal, round, and reactive to light.  Neck:     Comments: Some tenderness over musculature on the  left. Chest:     Chest wall: No tenderness.  Abdominal:     Tenderness: There is no abdominal tenderness.  Musculoskeletal:        General: Tenderness present.     Cervical back: Neck supple. Tenderness present.     Comments: Some tenderness over upper thoracic spine.  Neurological:     Mental Status: He is alert.     Comments: Awake and appropriate.  Normal speech.  No slurring.  Sensation and strength intact in bilateral upper extremities and lower extremities.     ED Results / Procedures / Treatments   Labs (all labs ordered are listed, but only abnormal results are displayed) Labs Reviewed  COMPREHENSIVE METABOLIC PANEL - Abnormal; Notable for the following components:      Result Value   Glucose, Bld 105 (*)    BUN 24 (*)    All other components within normal limits  CBC WITH DIFFERENTIAL/PLATELET    EKG None  Radiology MR Cervical Spine Wo Contrast  Result Date: 09/24/2022 CLINICAL DATA:  Neck trauma, ligament injury suspected (Age >= 16y). Neck pain EXAM: MRI CERVICAL SPINE WITHOUT CONTRAST TECHNIQUE: Multiplanar, multisequence MR imaging of the  cervical spine was performed. No intravenous contrast was administered. COMPARISON:  CT angiogram 09/24/2022, CT cervical spine 09/16/2022 FINDINGS: Alignment: Reversal of the cervical lordosis. No traumatic listhesis. Vertebrae: Possible nondisplaced fracture involving the C5 spinous process (series 8, image 8). Of note, no definite fracture was identified at this location on the CT angiogram of the neck from today. Elsewhere, no evidence of acute fracture. No findings to suggest discitis. No suspicious marrow replacing bone lesion. Cord: Normal signal and morphology. No epidural space fluid collection is identified. Posterior Fossa, vertebral arteries, paraspinal tissues: Anterior and posterior longitudinal ligaments appear intact. No evidence of ligamentum flavum disruption. There is edema within the interspinous ligaments at C3-4,  C4-5, and C5-6. Intact supraspinous ligament. Vertebral artery flow voids are maintained. No paraspinal fluid collection. Disc levels: C2-C3: No significant disc protrusion, foraminal stenosis, or canal stenosis. C3-C4: Left-sided facet and uncovertebral arthropathy resulting in moderate-severe left foraminal stenosis. There is mild right foraminal stenosis. No significant canal stenosis. C4-C5: Disc osteophyte complex with bilateral facet and uncovertebral arthropathy. No significant canal stenosis. Moderate-severe bilateral foraminal stenosis. C5-C6: Disc osteophyte complex with bilateral facet and uncovertebral hypertrophy. Severe canal stenosis with severe bilateral foraminal stenosis. C6-C7: Disc osteophyte complex with right greater than left facet and uncovertebral arthropathy. Moderate-severe right and mild left foraminal stenosis. Mild canal stenosis. C7-T1: No significant disc protrusion, foraminal stenosis, or canal stenosis. IMPRESSION: 1. Possible nondisplaced fracture involving the C5 spinous process. Of note, no definite fracture was identified at this location on the CT angiogram of the neck from today. 2. Edema within the interspinous ligaments at C3-4, C4-5, and C5-6, suggestive of ligamentous injury. Remaining cervical spinal ligaments appear intact. 3. Multilevel cervical spondylosis, most pronounced at C5-6 where there is severe canal stenosis and severe bilateral foraminal stenosis. 4. Moderate-severe left foraminal stenosis at C3-4. 5. Moderate-severe bilateral foraminal stenosis at C4-5. 6. Moderate-severe right foraminal stenosis at C6-7. Electronically Signed   By: Duanne Guess D.O.   On: 09/24/2022 14:15   MR BRAIN WO CONTRAST  Result Date: 09/24/2022 CLINICAL DATA:  Neuro deficit, acute, stroke suspected EXAM: MRI HEAD WITHOUT CONTRAST TECHNIQUE: Multiplanar, multiecho pulse sequences of the brain and surrounding structures were obtained without intravenous contrast. COMPARISON:   CTA head/neck from the same day. FINDINGS: Brain: No acute infarction, hemorrhage, hydrocephalus, extra-axial collection or mass lesion. Vascular: Major arterial flow voids are maintained at the skull base. Skull and upper cervical spine: Normal marrow signal. Sinuses/Orbits: Clear sinuses.  No acute orbital findings. Other: No mastoid effusions. IMPRESSION: No evidence of acute abnormality. Electronically Signed   By: Feliberto Harts M.D.   On: 09/24/2022 14:05   CT ANGIO HEAD NECK W WO CM  Result Date: 09/24/2022 CLINICAL DATA:  77 year old male neurologic deficit. Slurred speech. Recent fall. EXAM: CT ANGIOGRAPHY HEAD AND NECK WITH AND WITHOUT CONTRAST TECHNIQUE: Multidetector CT imaging of the head and neck was performed using the standard protocol during bolus administration of intravenous contrast. Multiplanar CT image reconstructions and MIPs were obtained to evaluate the vascular anatomy. Carotid stenosis measurements (when applicable) are obtained utilizing NASCET criteria, using the distal internal carotid diameter as the denominator. RADIATION DOSE REDUCTION: This exam was performed according to the departmental dose-optimization program which includes automated exposure control, adjustment of the mA and/or kV according to patient size and/or use of iterative reconstruction technique. CONTRAST:  75mL OMNIPAQUE IOHEXOL 350 MG/ML SOLN COMPARISON:  Head and cervical spine CT 09/16/2022. FINDINGS: CT HEAD Brain: Cerebral volume is within normal limits for  age. No midline shift, mass effect, or evidence of intracranial mass lesion. No acute intracranial hemorrhage identified. No ventriculomegaly. Stable gray-white matter differentiation throughout the brain. No cortically based acute infarct identified. Calvarium and skull base: No acute osseous abnormality identified. Paranasal sinuses: Visualized paranasal sinuses and mastoids are clear. Orbits: Negative orbit and scalp soft tissues. CTA NECK Skeleton:  No acute osseous abnormality identified. Cervical spine degeneration appears stable. Upper chest: Negative. Other neck: No acute finding. Aortic arch: Calcified aortic atherosclerosis.  3 vessel arch. Right carotid system: Mild brachiocephalic artery plaque without stenosis. Negative right CCA origin. Minimal right CCA plaque. Calcified plaque at the right carotid bifurcation, right ICA origin and bulb is mild. No associated stenosis. Left carotid system: Similar mild tortuosity. Mild left CCA plaque. Calcified plaque at the left carotid bifurcation more affects the ECA. No significant left ICA stenosis, however, there is a beaded appearance of the vessel raising the possibility of Fibromuscular Dysplasia (FMD). (Series 18, image 26). Vertebral arteries: Calcified right subclavian artery origin plaque with less than 50% stenosis. Calcified plaque at the right vertebral artery origin without stenosis. Right vertebral is patent to the skull base with no additional plaque or stenosis. Proximal left subclavian artery plaque without stenosis. Calcified plaque at the left vertebral artery origin without stenosis (series 14, image 269). Tortuous left V1 segment. Codominant left vertebral artery is patent to the skull base with no significant plaque or stenosis. CTA HEAD Posterior circulation: Codominant distal vertebral arteries and vertebrobasilar junction are patent without stenosis. Normal PICA origins. Patent basilar artery without stenosis. Patent SCA and left PCA origin. Fetal right PCA origin. Left posterior communicating artery diminutive or absent. Left PCA branches are within normal limits. Right PCA branches remarkable for moderate tandem irregularity and stenosis in the P2 and P3 segments (series 17, image 47 and series 19, image 20. But distal right PCA enhancement remains. Anterior circulation: Both ICA siphons are patent. Left siphon mild to moderate calcified plaque without stenosis. Right siphon similar  calcified plaque without significant stenosis. Normal right posterior communicating artery origin. Patent carotid termini. Patent MCA and ACA origins. Dominant left A1. Diminutive or absent anterior communicating artery. Bilateral ACA branches are within normal limits. Left MCA M1 segment and bifurcation are patent with mild tortuosity, no significant stenosis. No left MCA branch occlusion is identified, but there is mild to moderate MCA branch irregularity on series 19, image 31. Contralateral right MCA M1 is tortuous with mild irregularity, no stenosis. Right MCA trifurcation is patent without stenosis. No right MCA branch occlusion identified. Mild right MCA branch irregularity. Venous sinuses: Patent. Anatomic variants: Fetal right PCA origin.  Dominant left ACA A1. Review of the MIP images confirms the above findings IMPRESSION: 1. Negative for large vessel occlusion. Atherosclerosis in the neck without hemodynamically significant stenosis. But Intracranial Atherosclerosis with subsequent: - Moderate tandem stenoses of the Right PCA. - mild to moderate bilateral MCA branch irregularity. 2. 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 3. No acute intracranial abnormality. Stable CT appearance of the brain since last month. 4.  Aortic Atherosclerosis (ICD10-I70.0). Electronically Signed   By: Odessa Fleming M.D.   On: 09/24/2022 12:51   DG Thoracic Spine 2 View  Result Date: 09/24/2022 CLINICAL DATA:  Fall with back pain. EXAM: THORACIC SPINE 2 VIEWS COMPARISON:  Chest CT dated 07/09/2012 FINDINGS: There is no evidence of thoracic spine fracture. Alignment is normal. Moderate multilevel degenerative changes are seen in the spine. IMPRESSION: No acute fracture or subluxation. Electronically  Signed   By: Romona Curls M.D.   On: 09/24/2022 12:25    Procedures Procedures    Medications Ordered in ED Medications  iohexol (OMNIPAQUE) 350 MG/ML injection 75 mL (75 mLs Intravenous Contrast Given 09/24/22  1208)    ED Course/ Medical Decision Making/ A&P                                 Medical Decision Making Amount and/or Complexity of Data Reviewed Labs: ordered. Radiology: ordered.  Risk Prescription drug management.  Patient with difficulty speaking also tingling in his hands after a fall.  Differential diagnosis does include cervical spine injury with the tingling of the hands with causes such as central cord syndrome.  Also with difficulty speaking thought of stroke.  CTA done of head and neck and reassuring.  Does have some chronic disease but can be followed by PCP.  However MRI brain reassuring.  However cervical spine shows potential ligamentous injury.  Also some canal narrowing without cord injury.  Discussed with neurosurgery.  Hard collar with outpatient follow-up.  Can take off to bathe but otherwise should be wearing it.  Discussed with patient.  Will discharge home.  Lab work reassuring.  Difficulty speaking could be due to the medications of muscle relaxer and Toradol.  Appears stable for discharge home.   Scans document review of labs and clinical decision tools ie heart score, Chads2Vasc2 etc:1}      Final Clinical Impression(s) / ED Diagnoses Final diagnoses:  Acute sprain of ligament of cervical spine    Rx / DC Orders ED Discharge Orders     None         Benjiman Core, MD 09/24/22 1501

## 2022-10-02 DIAGNOSIS — M5412 Radiculopathy, cervical region: Secondary | ICD-10-CM | POA: Diagnosis not present

## 2022-10-16 DIAGNOSIS — M5412 Radiculopathy, cervical region: Secondary | ICD-10-CM | POA: Diagnosis not present

## 2022-11-13 DIAGNOSIS — M5412 Radiculopathy, cervical region: Secondary | ICD-10-CM | POA: Diagnosis not present

## 2022-11-16 ENCOUNTER — Other Ambulatory Visit: Payer: Self-pay | Admitting: Neurological Surgery

## 2022-12-03 NOTE — Pre-Procedure Instructions (Signed)
Surgical Instructions   Your procedure is scheduled on December 10, 2022. Report to [facility] Main Entrance "A" at 10:00 A.M., then check in with the Admitting office. Any questions or running late day of surgery: call [phone]  Questions prior to your surgery date: call [phone], Monday-Friday, 8am-4pm. If you experience any cold or flu symptoms such as cough, fever, chills, shortness of breath, etc. between now and your scheduled surgery, please notify us at the above number.     Remember:  Do not eat or drink after midnight the night before your surgery    Take these medicines the morning of surgery with A SIP OF WATER: escitalopram (LEXAPRO)    May take these medicines IF NEEDED: acetaminophen (TYLENOL)  traMADol (ULTRAM)    Follow your surgeon's instructions on when to stop Asprin.  If no instructions were given by your surgeon then you will need to call the office to get those instructions.     One week prior to surgery, STOP taking any Aleve, Naproxen, Ibuprofen, Motrin, Advil, Goody's, BC's, all herbal medications, fish oil, and non-prescription vitamins.                     Do NOT Smoke (Tobacco/Vaping) for 24 hours prior to your procedure.  If you use a CPAP at night, you may bring your mask/headgear for your overnight stay.   You will be asked to remove any contacts, glasses, piercing's, hearing aid's, dentures/partials prior to surgery. Please bring cases for these items if needed.    Patients discharged the day of surgery will not be allowed to drive home, and someone needs to stay with them for 24 hours.  SURGICAL WAITING ROOM VISITATION Patients may have no more than 2 support people in the waiting area - these visitors may rotate.   Pre-op nurse will coordinate an appropriate time for 1 ADULT support person, who may not rotate, to accompany patient in pre-op.  Children under the age of 81 must have an adult with them who is not the patient and must  remain in the main waiting area with an adult.  If the patient needs to stay at the hospital during part of their recovery, the visitor guidelines for inpatient rooms apply.  Please refer to the [facility] website for the visitor guidelines for any additional information.   If you received a COVID test during your pre-op visit  it is requested that you wear a mask when out in public, stay away from anyone that may not be feeling well and notify your surgeon if you develop symptoms. If you have been in contact with anyone that has tested positive in the last 10 days please notify you surgeon.      Pre-operative 5 CHG Bathing Instructions   You can play a key role in reducing the risk of infection after surgery. Your skin needs to be as free of germs as possible. You can reduce the number of germs on your skin by washing with CHG (chlorhexidine gluconate) soap before surgery. CHG is an antiseptic soap that kills germs and continues to kill germs even after washing.   DO NOT use if you have an allergy to chlorhexidine/CHG or antibacterial soaps. If your skin becomes reddened or irritated, stop using the CHG and notify one of our RNs at [phone].   Please shower with the CHG soap starting 4 days before surgery using the following schedule:     Please keep in mind the following:  DO NOT shave, including legs and underarms, starting the day of your first shower.   You may shave your face at any point before/day of surgery.  Place clean sheets on your bed the day you start using CHG soap. Use a clean washcloth (not used since being washed) for each shower. DO NOT sleep with pets once you start using the CHG.   CHG Shower Instructions:  Wash your face and private area with normal soap. If you choose to wash your hair, wash first with your normal shampoo.  After you use shampoo/soap, rinse your hair and body thoroughly to remove shampoo/soap residue.  Turn the water OFF and apply about 3  tablespoons (45 ml) of CHG soap to a CLEAN washcloth.  Apply CHG soap ONLY FROM YOUR NECK DOWN TO YOUR TOES (washing for 3-5 minutes)  DO NOT use CHG soap on face, private areas, open wounds, or sores.  Pay special attention to the area where your surgery is being performed.  If you are having back surgery, having someone wash your back for you may be helpful. Wait 2 minutes after CHG soap is applied, then you may rinse off the CHG soap.  Pat dry with a clean towel  Put on clean clothes/pajamas   If you choose to wear lotion, please use ONLY the CHG-compatible lotions on the back of this paper.   Additional instructions for the day of surgery: DO NOT APPLY any lotions, deodorants, cologne, or perfumes.   Do not bring valuables to the hospital. [facility] is not responsible for any belongings/valuables. Do not wear nail polish, gel polish, artificial nails, or any other type of covering on natural nails (fingers and toes) Do not wear jewelry or makeup Put on clean/comfortable clothes.  Please brush your teeth.  Ask your nurse before applying any prescription medications to the skin.     CHG Compatible Lotions   Aveeno Moisturizing lotion  Cetaphil Moisturizing Cream  Cetaphil Moisturizing Lotion  Clairol Herbal Essence Moisturizing Lotion, Dry Skin  Clairol Herbal Essence Moisturizing Lotion, Extra Dry Skin  Clairol Herbal Essence Moisturizing Lotion, Normal Skin  Curel Age Defying Therapeutic Moisturizing Lotion with Alpha Hydroxy  Curel Extreme Care Body Lotion  Curel Soothing Hands Moisturizing Hand Lotion  Curel Therapeutic Moisturizing Cream, Fragrance-Free  Curel Therapeutic Moisturizing Lotion, Fragrance-Free  Curel Therapeutic Moisturizing Lotion, Original Formula  Eucerin Daily Replenishing Lotion  Eucerin Dry Skin Therapy Plus Alpha Hydroxy Crme  Eucerin Dry Skin Therapy Plus Alpha Hydroxy Lotion  Eucerin Original Crme  Eucerin Original Lotion  Eucerin Plus Crme  Eucerin Plus Lotion  Eucerin TriLipid Replenishing Lotion  Keri Anti-Bacterial Hand Lotion  Keri Deep Conditioning Original Lotion Dry Skin Formula Softly Scented  Keri Deep Conditioning Original Lotion, Fragrance Free Sensitive Skin Formula  Keri Lotion Fast Absorbing Fragrance Free Sensitive Skin Formula  Keri Lotion Fast Absorbing Softly Scented Dry Skin Formula  Keri Original Lotion  Keri Skin Renewal Lotion Keri Silky Smooth Lotion  Keri Silky Smooth Sensitive Skin Lotion  Nivea Body Creamy Conditioning Oil  Nivea Body Extra Enriched Lotion  Nivea Body Original Lotion  Nivea Body Sheer Moisturizing Lotion Nivea Crme  Nivea Skin Firming Lotion  NutraDerm 30 Skin Lotion  NutraDerm Skin Lotion  NutraDerm Therapeutic Skin Cream  NutraDerm Therapeutic Skin Lotion  ProShield Protective Hand Cream  Provon moisturizing lotion  Please read over the following fact sheets that you were given.

## 2022-12-04 ENCOUNTER — Inpatient Hospital Stay (HOSPITAL_COMMUNITY)
Admission: RE | Admit: 2022-12-04 | Discharge: 2022-12-04 | Disposition: A | Discharge status: Home / Self Care | Payer: Medicare PPO | Source: Ambulatory Visit | Attending: Neurological Surgery

## 2022-12-04 ENCOUNTER — Other Ambulatory Visit: Payer: Self-pay

## 2022-12-04 ENCOUNTER — Encounter (HOSPITAL_COMMUNITY): Payer: Self-pay

## 2022-12-04 VITALS — BP 140/72 | HR 74 | Temp 98.0°F | Resp 17 | Ht 68.0 in | Wt 159.0 lb

## 2022-12-04 DIAGNOSIS — Z01812 Encounter for preprocedural laboratory examination: Secondary | ICD-10-CM | POA: Diagnosis not present

## 2022-12-04 DIAGNOSIS — Z79899 Other long term (current) drug therapy: Secondary | ICD-10-CM | POA: Insufficient documentation

## 2022-12-04 DIAGNOSIS — Z01818 Encounter for other preprocedural examination: Secondary | ICD-10-CM

## 2022-12-04 HISTORY — DX: Anxiety disorder, unspecified: F41.9

## 2022-12-04 HISTORY — DX: Unspecified osteoarthritis, unspecified site: M19.90

## 2022-12-04 HISTORY — DX: Depression, unspecified: F32.A

## 2022-12-04 HISTORY — DX: Myoneural disorder, unspecified: G70.9

## 2022-12-04 LAB — CBC
HCT: 41.9 % (ref 39.0–52.0)
Hemoglobin: 13.5 g/dL (ref 13.0–17.0)
MCH: 30.1 pg (ref 26.0–34.0)
MCHC: 32.2 g/dL (ref 30.0–36.0)
MCV: 93.3 fL (ref 80.0–100.0)
Platelets: 328 10*3/uL (ref 150–400)
RBC: 4.49 MIL/uL (ref 4.22–5.81)
RDW: 12.7 % (ref 11.5–15.5)
WBC: 7.4 10*3/uL (ref 4.0–10.5)
nRBC: 0 % (ref 0.0–0.2)

## 2022-12-04 LAB — SURGICAL PCR SCREEN
MRSA, PCR: NEGATIVE
Staphylococcus aureus: NEGATIVE

## 2022-12-04 LAB — BASIC METABOLIC PANEL
Anion gap: 8 (ref 5–15)
BUN: 23 mg/dL (ref 8–23)
CO2: 28 mmol/L (ref 22–32)
Calcium: 9.2 mg/dL (ref 8.9–10.3)
Chloride: 103 mmol/L (ref 98–111)
Creatinine, Ser: 0.98 mg/dL (ref 0.61–1.24)
GFR, Estimated: >60 mL/min (ref >60)
Glucose, Bld: 105 mg/dL — ABNORMAL HIGH (ref 70–99)
Potassium: 4.1 mmol/L (ref 3.5–5.1)
Sodium: 139 mmol/L (ref 135–145)

## 2022-12-04 LAB — TYPE AND SCREEN
ABO/RH(D): O POS
Antibody Screen: NEGATIVE

## 2022-12-04 LAB — PROTIME-INR
INR: 1 (ref 0.8–1.2)
Prothrombin Time: 13.7 s (ref 11.4–15.2)

## 2022-12-04 NOTE — Progress Notes (Signed)
PCP - Dr. Noberto Retort Cardiologist - Denies - Pt states he has seen a cardiologist over the years, but CP cause was always acid reflux.   PPM/ICD - Denies Device Orders - n/a Rep Notified - n/a  Chest x-ray - n/a EKG - Per pt, many years ago Stress Test - Per pt, many years ago ECHO - Denies Cardiac Cath - Denies  Sleep Study - Denies CPAP - n/a  No DM  Last dose of GLP1 agonist- n/a GLP1 instructions: n/a  Blood Thinner Instructions: n/a Aspirin Instructions: Pts last dose of ASA was 11/11  NPO after midnight  COVID TEST- n/a   Anesthesia review: No.   Patient denies shortness of breath, fever, cough and chest pain at PAT appointment. Pt denies any respiratory illness/infection in the last two months.    All instructions explained to the patient, with a verbal understanding of the material. Patient agrees to go over the instructions while at home for a better understanding. Patient also instructed to self quarantine after being tested for COVID-19. The opportunity to ask questions was provided.

## 2022-12-10 ENCOUNTER — Other Ambulatory Visit: Payer: Self-pay

## 2022-12-10 ENCOUNTER — Inpatient Hospital Stay (HOSPITAL_COMMUNITY): Payer: Medicare PPO

## 2022-12-10 ENCOUNTER — Encounter (HOSPITAL_COMMUNITY)
Admission: RE | Disposition: A | Discharge status: Home / Self Care | Payer: Self-pay | Source: Home / Self Care | Attending: Neurological Surgery

## 2022-12-10 ENCOUNTER — Inpatient Hospital Stay (HOSPITAL_COMMUNITY)
Admission: RE | Admit: 2022-12-10 | Discharge: 2022-12-11 | DRG: 472 | Disposition: A | Discharge status: Home / Self Care | Payer: Medicare PPO | Attending: Neurological Surgery | Admitting: Neurological Surgery

## 2022-12-10 ENCOUNTER — Encounter (HOSPITAL_COMMUNITY): Payer: Self-pay | Admitting: Neurological Surgery

## 2022-12-10 DIAGNOSIS — Z888 Allergy status to other drugs, medicaments and biological substances status: Secondary | ICD-10-CM | POA: Diagnosis not present

## 2022-12-10 DIAGNOSIS — M4712 Other spondylosis with myelopathy, cervical region: Secondary | ICD-10-CM | POA: Diagnosis not present

## 2022-12-10 DIAGNOSIS — Z9889 Other specified postprocedural states: Secondary | ICD-10-CM

## 2022-12-10 DIAGNOSIS — Z981 Arthrodesis status: Secondary | ICD-10-CM | POA: Diagnosis not present

## 2022-12-10 DIAGNOSIS — Z7982 Long term (current) use of aspirin: Secondary | ICD-10-CM | POA: Diagnosis not present

## 2022-12-10 DIAGNOSIS — M4722 Other spondylosis with radiculopathy, cervical region: Secondary | ICD-10-CM | POA: Diagnosis present

## 2022-12-10 DIAGNOSIS — M50123 Cervical disc disorder at C6-C7 level with radiculopathy: Secondary | ICD-10-CM | POA: Diagnosis not present

## 2022-12-10 DIAGNOSIS — Z4682 Encounter for fitting and adjustment of non-vascular catheter: Secondary | ICD-10-CM | POA: Diagnosis not present

## 2022-12-10 DIAGNOSIS — Z87891 Personal history of nicotine dependence: Secondary | ICD-10-CM | POA: Diagnosis not present

## 2022-12-10 DIAGNOSIS — M4802 Spinal stenosis, cervical region: Secondary | ICD-10-CM | POA: Diagnosis present

## 2022-12-10 DIAGNOSIS — Z9089 Acquired absence of other organs: Secondary | ICD-10-CM

## 2022-12-10 DIAGNOSIS — K922 Gastrointestinal hemorrhage, unspecified: Secondary | ICD-10-CM | POA: Diagnosis not present

## 2022-12-10 DIAGNOSIS — G629 Polyneuropathy, unspecified: Secondary | ICD-10-CM | POA: Diagnosis present

## 2022-12-10 DIAGNOSIS — Z79899 Other long term (current) drug therapy: Secondary | ICD-10-CM

## 2022-12-10 HISTORY — PX: ANTERIOR CERVICAL DECOMP/DISCECTOMY FUSION: SHX1161

## 2022-12-10 SURGERY — ANTERIOR CERVICAL DECOMPRESSION/DISCECTOMY FUSION 3 LEVELS
Anesthesia: General

## 2022-12-10 MED ORDER — FENTANYL CITRATE (PF) 250 MCG/5ML IJ SOLN
INTRAMUSCULAR | Status: AC
  Filled 2022-12-10: qty 5

## 2022-12-10 MED ORDER — DEXAMETHASONE 4 MG PO TABS
4.0000 mg | ORAL_TABLET | Freq: Four times a day (QID) | ORAL | Status: DC
  Administered 2022-12-10 – 2022-12-11 (×3): 4 mg via ORAL
  Filled 2022-12-10 (×3): qty 1

## 2022-12-10 MED ORDER — THROMBIN 5000 UNITS EX SOLR: OROMUCOSAL | Status: DC | PRN

## 2022-12-10 MED ORDER — FENTANYL CITRATE (PF) 100 MCG/2ML IJ SOLN
INTRAMUSCULAR | Status: AC
  Filled 2022-12-10: qty 2

## 2022-12-10 MED ORDER — CHLORHEXIDINE GLUCONATE CLOTH 2 % EX PADS: 6.0000 | MEDICATED_PAD | Freq: Once | CUTANEOUS | Status: DC

## 2022-12-10 MED ORDER — MORPHINE SULFATE (PF) 2 MG/ML IV SOLN: 2.0000 mg | INTRAVENOUS | Status: DC | PRN

## 2022-12-10 MED ORDER — DEXAMETHASONE SODIUM PHOSPHATE 4 MG/ML IJ SOLN: 4.0000 mg | Freq: Four times a day (QID) | INTRAMUSCULAR | Status: DC

## 2022-12-10 MED ORDER — MENTHOL 3 MG MT LOZG
1.0000 | LOZENGE | OROMUCOSAL | Status: DC | PRN
  Filled 2022-12-10: qty 9

## 2022-12-10 MED ORDER — LIDOCAINE 2% (20 MG/ML) 5 ML SYRINGE
INTRAMUSCULAR | Status: DC | PRN
  Administered 2022-12-10: 60 mg via INTRAVENOUS

## 2022-12-10 MED ORDER — ONDANSETRON HCL 4 MG PO TABS: 4.0000 mg | ORAL_TABLET | Freq: Four times a day (QID) | ORAL | Status: DC | PRN

## 2022-12-10 MED ORDER — THROMBIN 5000 UNITS EX SOLR
CUTANEOUS | Status: AC
  Filled 2022-12-10: qty 10000

## 2022-12-10 MED ORDER — BUPIVACAINE HCL (PF) 0.25 % IJ SOLN
INTRAMUSCULAR | Status: AC
  Filled 2022-12-10: qty 30

## 2022-12-10 MED ORDER — CHLORHEXIDINE GLUCONATE 0.12 % MT SOLN
OROMUCOSAL | Status: AC
  Administered 2022-12-10: 15 mL via OROMUCOSAL
  Filled 2022-12-10: qty 15

## 2022-12-10 MED ORDER — SODIUM CHLORIDE 0.9% FLUSH: 3.0000 mL | INTRAVENOUS | Status: DC | PRN

## 2022-12-10 MED ORDER — AMISULPRIDE (ANTIEMETIC) 5 MG/2ML IV SOLN
10.0000 mg | Freq: Once | INTRAVENOUS | Status: DC | PRN
Start: 2022-12-10 — End: 2022-12-10

## 2022-12-10 MED ORDER — HYDROMORPHONE HCL 1 MG/ML IJ SOLN
INTRAMUSCULAR | Status: DC | PRN
  Administered 2022-12-10: .5 mg via INTRAVENOUS

## 2022-12-10 MED ORDER — PROPOFOL 10 MG/ML IV BOLUS
INTRAVENOUS | Status: DC | PRN
  Administered 2022-12-10: 150 mg via INTRAVENOUS

## 2022-12-10 MED ORDER — 0.9 % SODIUM CHLORIDE (POUR BTL) OPTIME
TOPICAL | Status: DC | PRN
  Administered 2022-12-10: 1000 mL

## 2022-12-10 MED ORDER — FENTANYL CITRATE (PF) 250 MCG/5ML IJ SOLN
INTRAMUSCULAR | Status: DC | PRN
  Administered 2022-12-10: 50 ug via INTRAVENOUS
  Administered 2022-12-10: 100 ug via INTRAVENOUS
  Administered 2022-12-10: 50 ug via INTRAVENOUS

## 2022-12-10 MED ORDER — EPHEDRINE SULFATE-NACL 50-0.9 MG/10ML-% IV SOSY
PREFILLED_SYRINGE | INTRAVENOUS | Status: DC | PRN
  Administered 2022-12-10 (×2): 2.5 mg via INTRAVENOUS

## 2022-12-10 MED ORDER — LABETALOL HCL 5 MG/ML IV SOLN
10.0000 mg | Freq: Once | INTRAVENOUS | Status: AC
  Administered 2022-12-10: 10 mg via INTRAVENOUS

## 2022-12-10 MED ORDER — ONDANSETRON HCL 4 MG/2ML IJ SOLN
INTRAMUSCULAR | Status: DC | PRN
  Administered 2022-12-10: 4 mg via INTRAVENOUS

## 2022-12-10 MED ORDER — LACTATED RINGERS IV SOLN: INTRAVENOUS | Status: DC

## 2022-12-10 MED ORDER — LABETALOL HCL 5 MG/ML IV SOLN
INTRAVENOUS | Status: AC
  Filled 2022-12-10: qty 4

## 2022-12-10 MED ORDER — PHENYLEPHRINE HCL-NACL 20-0.9 MG/250ML-% IV SOLN
INTRAVENOUS | Status: DC | PRN
  Administered 2022-12-10: 15 ug/min via INTRAVENOUS

## 2022-12-10 MED ORDER — HYDROMORPHONE HCL 1 MG/ML IJ SOLN
INTRAMUSCULAR | Status: AC
  Filled 2022-12-10: qty 0.5

## 2022-12-10 MED ORDER — ACETAMINOPHEN 500 MG PO TABS
1000.0000 mg | ORAL_TABLET | Freq: Once | ORAL | Status: AC
  Filled 2022-12-10: qty 2

## 2022-12-10 MED ORDER — SUGAMMADEX SODIUM 200 MG/2ML IV SOLN
INTRAVENOUS | Status: DC | PRN
  Administered 2022-12-10: 200 mg via INTRAVENOUS

## 2022-12-10 MED ORDER — ACETAMINOPHEN 500 MG PO TABS: 1000.0000 mg | ORAL_TABLET | ORAL | Status: DC

## 2022-12-10 MED ORDER — THROMBIN 5000 UNITS EX SOLR
CUTANEOUS | Status: AC
  Filled 2022-12-10: qty 5000

## 2022-12-10 MED ORDER — ACETAMINOPHEN 325 MG PO TABS: 650.0000 mg | ORAL_TABLET | ORAL | Status: DC | PRN

## 2022-12-10 MED ORDER — ACETAMINOPHEN 650 MG RE SUPP: 650.0000 mg | RECTAL | Status: DC | PRN

## 2022-12-10 MED ORDER — CEFAZOLIN SODIUM-DEXTROSE 2-4 GM/100ML-% IV SOLN
2.0000 g | INTRAVENOUS | Status: AC
  Administered 2022-12-10: 2 g via INTRAVENOUS

## 2022-12-10 MED ORDER — GABAPENTIN 300 MG PO CAPS: 300.0000 mg | ORAL_CAPSULE | ORAL | Status: AC

## 2022-12-10 MED ORDER — METHOCARBAMOL 500 MG PO TABS
500.0000 mg | ORAL_TABLET | Freq: Four times a day (QID) | ORAL | Status: DC | PRN
  Administered 2022-12-10: 500 mg via ORAL
  Filled 2022-12-10: qty 1

## 2022-12-10 MED ORDER — HYDROCODONE-ACETAMINOPHEN 5-325 MG PO TABS
1.0000 | ORAL_TABLET | ORAL | Status: DC | PRN
  Administered 2022-12-10 – 2022-12-11 (×4): 1 via ORAL
  Filled 2022-12-10 (×4): qty 1

## 2022-12-10 MED ORDER — SODIUM CHLORIDE 0.9 % IV SOLN
250.0000 mL | INTRAVENOUS | Status: DC
  Administered 2022-12-10: 250 mL via INTRAVENOUS

## 2022-12-10 MED ORDER — CHLORHEXIDINE GLUCONATE 0.12 % MT SOLN
15.0000 mL | Freq: Once | OROMUCOSAL | Status: AC
Start: 2022-12-10 — End: 2022-12-10

## 2022-12-10 MED ORDER — CEFAZOLIN SODIUM-DEXTROSE 2-4 GM/100ML-% IV SOLN
INTRAVENOUS | Status: AC
  Filled 2022-12-10: qty 100

## 2022-12-10 MED ORDER — DEXAMETHASONE SODIUM PHOSPHATE 10 MG/ML IJ SOLN
INTRAMUSCULAR | Status: DC | PRN
  Administered 2022-12-10: 10 mg via INTRAVENOUS

## 2022-12-10 MED ORDER — GABAPENTIN 300 MG PO CAPS
ORAL_CAPSULE | ORAL | Status: AC
  Administered 2022-12-10: 300 mg via ORAL
  Filled 2022-12-10: qty 1

## 2022-12-10 MED ORDER — DEXAMETHASONE SODIUM PHOSPHATE 10 MG/ML IJ SOLN
INTRAMUSCULAR | Status: AC
  Filled 2022-12-10: qty 1

## 2022-12-10 MED ORDER — BUPIVACAINE HCL (PF) 0.25 % IJ SOLN
INTRAMUSCULAR | Status: DC | PRN
  Administered 2022-12-10: 3 mL

## 2022-12-10 MED ORDER — CEFAZOLIN SODIUM-DEXTROSE 2-4 GM/100ML-% IV SOLN
2.0000 g | Freq: Three times a day (TID) | INTRAVENOUS | Status: AC
  Administered 2022-12-10 – 2022-12-11 (×2): 2 g via INTRAVENOUS
  Filled 2022-12-10 (×2): qty 100

## 2022-12-10 MED ORDER — ONDANSETRON HCL 4 MG/2ML IJ SOLN: 4.0000 mg | Freq: Four times a day (QID) | INTRAMUSCULAR | Status: DC | PRN

## 2022-12-10 MED ORDER — SENNA 8.6 MG PO TABS
1.0000 | ORAL_TABLET | Freq: Two times a day (BID) | ORAL | Status: DC
  Administered 2022-12-10 – 2022-12-11 (×2): 8.6 mg via ORAL
  Filled 2022-12-10 (×2): qty 1

## 2022-12-10 MED ORDER — METHOCARBAMOL 1000 MG/10ML IJ SOLN: 500.0000 mg | Freq: Four times a day (QID) | INTRAMUSCULAR | Status: DC | PRN

## 2022-12-10 MED ORDER — ORAL CARE MOUTH RINSE: 15.0000 mL | Freq: Once | OROMUCOSAL | Status: AC

## 2022-12-10 MED ORDER — SODIUM CHLORIDE 0.9% FLUSH
3.0000 mL | Freq: Two times a day (BID) | INTRAVENOUS | Status: DC
  Administered 2022-12-10: 3 mL via INTRAVENOUS

## 2022-12-10 MED ORDER — DEXTROSE-SODIUM CHLORIDE 5-0.45 % IV SOLN
INTRAVENOUS | Status: DC
Start: 2022-12-10 — End: 2022-12-11

## 2022-12-10 MED ORDER — ROCURONIUM BROMIDE 10 MG/ML (PF) SYRINGE
PREFILLED_SYRINGE | INTRAVENOUS | Status: DC | PRN
  Administered 2022-12-10: 30 mg via INTRAVENOUS
  Administered 2022-12-10 (×2): 20 mg via INTRAVENOUS
  Administered 2022-12-10: 50 mg via INTRAVENOUS

## 2022-12-10 MED ORDER — FENTANYL CITRATE (PF) 100 MCG/2ML IJ SOLN
25.0000 ug | INTRAMUSCULAR | Status: DC | PRN
Start: 2022-12-10 — End: 2022-12-10
  Administered 2022-12-10: 25 ug via INTRAVENOUS

## 2022-12-10 MED ORDER — ESCITALOPRAM OXALATE 10 MG PO TABS
10.0000 mg | ORAL_TABLET | Freq: Every day | ORAL | Status: DC
  Administered 2022-12-11: 10 mg via ORAL
  Filled 2022-12-10: qty 1

## 2022-12-10 MED ORDER — PHENOL 1.4 % MT LIQD: 1.0000 | OROMUCOSAL | Status: DC | PRN

## 2022-12-10 SURGICAL SUPPLY — 48 items
BAG COUNTER SPONGE SURGICOUNT (BAG) ×2 IMPLANT
BASKET BONE COLLECTION (BASKET) ×2 IMPLANT
BENZOIN TINCTURE PRP APPL 2/3 (GAUZE/BANDAGES/DRESSINGS) ×2 IMPLANT
BIT DRILL 2.3 STRL 12 (BIT) IMPLANT
BUR CARBIDE MATCH 3.0 (BURR) ×2 IMPLANT
CANISTER SUCT 3000ML PPV (MISCELLANEOUS) ×2 IMPLANT
DRAPE C-ARM 42X72 X-RAY (DRAPES) ×4 IMPLANT
DRAPE LAPAROTOMY 100X72 PEDS (DRAPES) ×2 IMPLANT
DRAPE MICROSCOPE SLANT 54X150 (MISCELLANEOUS) IMPLANT
DRSG OPSITE POSTOP 3X4 (GAUZE/BANDAGES/DRESSINGS) IMPLANT
DURAPREP 6ML APPLICATOR 50/CS (WOUND CARE) ×2 IMPLANT
ELECT COATED BLADE 2.86 ST (ELECTRODE) ×2 IMPLANT
ELECT REM PT RETURN 9FT ADLT (ELECTROSURGICAL) ×1
ELECTRODE REM PT RTRN 9FT ADLT (ELECTROSURGICAL) ×2 IMPLANT
GAUZE 4X4 16PLY ~~LOC~~+RFID DBL (SPONGE) IMPLANT
GLOVE BIO SURGEON STRL SZ7 (GLOVE) ×2 IMPLANT
GLOVE BIO SURGEON STRL SZ8 (GLOVE) ×2 IMPLANT
GLOVE BIOGEL PI IND STRL 7.0 (GLOVE) ×2 IMPLANT
GOWN STRL REUS W/ TWL LRG LVL3 (GOWN DISPOSABLE) IMPLANT
GOWN STRL REUS W/ TWL XL LVL3 (GOWN DISPOSABLE) ×2 IMPLANT
GOWN STRL REUS W/TWL 2XL LVL3 (GOWN DISPOSABLE) IMPLANT
GOWN STRL REUS W/TWL LRG LVL3 (GOWN DISPOSABLE)
GOWN STRL REUS W/TWL XL LVL3 (GOWN DISPOSABLE) ×1
HEMOSTAT POWDER KIT SURGIFOAM (HEMOSTASIS) ×2 IMPLANT
KIT BASIN OR (CUSTOM PROCEDURE TRAY) ×2 IMPLANT
KIT TURNOVER KIT B (KITS) ×2 IMPLANT
NDL HYPO 25X1 1.5 SAFETY (NEEDLE) ×2 IMPLANT
NDL SPNL 20GX3.5 QUINCKE YW (NEEDLE) ×2 IMPLANT
NEEDLE HYPO 25X1 1.5 SAFETY (NEEDLE) ×1 IMPLANT
NEEDLE SPNL 20GX3.5 QUINCKE YW (NEEDLE) ×1 IMPLANT
NS IRRIG 1000ML POUR BTL (IV SOLUTION) ×2 IMPLANT
PACK LAMINECTOMY NEURO (CUSTOM PROCEDURE TRAY) ×2 IMPLANT
PAD ARMBOARD 7.5X6 YLW CONV (MISCELLANEOUS) ×2 IMPLANT
PIN DISTRACTION 14MM (PIN) ×4 IMPLANT
PLATE LOCK ACP INSIG 56 3L (Plate) IMPLANT
PUTTY BONE 2.5CC (Putty) IMPLANT
SCREW VA SINGLE LEAD 4X14 (Screw) ×8 IMPLANT
SCREW VA SINGLE LEAD 4X14 ST (Screw) IMPLANT
SPACER IDENTITI 6X16X14 7D (Screw) IMPLANT
SPACER IDENTITI 7X16X14 7D (Spacer) IMPLANT
SPONGE INTESTINAL PEANUT (DISPOSABLE) ×2 IMPLANT
SPONGE SURGIFOAM ABS GEL 100 (HEMOSTASIS) IMPLANT
STRIP CLOSURE SKIN 1/2X4 (GAUZE/BANDAGES/DRESSINGS) ×2 IMPLANT
SUT VIC AB 3-0 SH 8-18 (SUTURE) ×2 IMPLANT
SUT VICRYL 4-0 PS2 18IN ABS (SUTURE) IMPLANT
TOWEL GREEN STERILE (TOWEL DISPOSABLE) ×2 IMPLANT
TOWEL GREEN STERILE FF (TOWEL DISPOSABLE) ×2 IMPLANT
WATER STERILE IRR 1000ML POUR (IV SOLUTION) ×2 IMPLANT

## 2022-12-10 NOTE — Anesthesia Preprocedure Evaluation (Signed)
Anesthesia Evaluation  Patient identified by MRN, date of birth, ID band Patient awake    Reviewed: Allergy & Precautions, NPO status , Patient's Chart, lab work & pertinent test results  Airway Mallampati: II  TM Distance: >3 FB Neck ROM: Full    Dental   Pulmonary former smoker   breath sounds clear to auscultation       Cardiovascular negative cardio ROS  Rhythm:Regular Rate:Normal     Neuro/Psych  Neuromuscular disease    GI/Hepatic negative GI ROS, Neg liver ROS,,,  Endo/Other  negative endocrine ROS    Renal/GU negative Renal ROS     Musculoskeletal  (+) Arthritis ,    Abdominal   Peds  Hematology negative hematology ROS (+)   Anesthesia Other Findings   Reproductive/Obstetrics                             Anesthesia Physical Anesthesia Plan  ASA: 2  Anesthesia Plan: General   Post-op Pain Management: Tylenol PO (pre-op)* and Gabapentin PO (pre-op)*   Induction: Intravenous  PONV Risk Score and Plan: 2 and Dexamethasone and Ondansetron  Airway Management Planned: Oral ETT and Video Laryngoscope Planned  Additional Equipment:   Intra-op Plan:   Post-operative Plan: Extubation in OR  Informed Consent: I have reviewed the patients History and Physical, chart, labs and discussed the procedure including the risks, benefits and alternatives for the proposed anesthesia with the patient or authorized representative who has indicated his/her understanding and acceptance.     Dental advisory given  Plan Discussed with: CRNA  Anesthesia Plan Comments:        Anesthesia Quick Evaluation

## 2022-12-10 NOTE — Plan of Care (Signed)
Problem: Education: Goal: Knowledge of General Education information will improve Description: Including pain rating scale, medication(s)/side effects and non-pharmacologic comfort measures 12/10/2022 1823 by Vincente Liberty, RN Outcome: Progressing 12/10/2022 1654 by Vincente Liberty, RN Outcome: Progressing   Problem: Health Behavior/Discharge Planning: Goal: Ability to manage health-related needs will improve 12/10/2022 1823 by Vincente Liberty, RN Outcome: Progressing 12/10/2022 1654 by Vincente Liberty, RN Outcome: Progressing   Problem: Clinical Measurements: Goal: Ability to maintain clinical measurements within normal limits will improve 12/10/2022 1823 by Vincente Liberty, RN Outcome: Progressing 12/10/2022 1654 by Vincente Liberty, RN Outcome: Progressing Goal: Will remain free from infection 12/10/2022 1823 by Vincente Liberty, RN Outcome: Progressing 12/10/2022 1654 by Vincente Liberty, RN Outcome: Progressing Goal: Diagnostic test results will improve 12/10/2022 1823 by Vincente Liberty, RN Outcome: Progressing 12/10/2022 1654 by Vincente Liberty, RN Outcome: Progressing Goal: Respiratory complications will improve 12/10/2022 1823 by Vincente Liberty, RN Outcome: Progressing 12/10/2022 1654 by Vincente Liberty, RN Outcome: Progressing Goal: Cardiovascular complication will be avoided 12/10/2022 1823 by Vincente Liberty, RN Outcome: Progressing 12/10/2022 1654 by Vincente Liberty, RN Outcome: Progressing   Problem: Activity: Goal: Risk for activity intolerance will decrease 12/10/2022 1823 by Vincente Liberty, RN Outcome: Progressing 12/10/2022 1654 by Vincente Liberty, RN Outcome: Progressing   Problem: Nutrition: Goal: Adequate nutrition will be maintained 12/10/2022 1823 by Vincente Liberty, RN Outcome: Progressing 12/10/2022 1654 by Vincente Liberty, RN Outcome: Progressing   Problem: Coping: Goal: Level of anxiety will decrease 12/10/2022  1823 by Vincente Liberty, RN Outcome: Progressing 12/10/2022 1654 by Vincente Liberty, RN Outcome: Progressing   Problem: Elimination: Goal: Will not experience complications related to bowel motility 12/10/2022 1823 by Vincente Liberty, RN Outcome: Progressing 12/10/2022 1654 by Vincente Liberty, RN Outcome: Progressing Goal: Will not experience complications related to urinary retention 12/10/2022 1823 by Vincente Liberty, RN Outcome: Progressing 12/10/2022 1654 by Vincente Liberty, RN Outcome: Progressing   Problem: Pain Management: Goal: General experience of comfort will improve 12/10/2022 1823 by Vincente Liberty, RN Outcome: Progressing 12/10/2022 1654 by Vincente Liberty, RN Outcome: Progressing   Problem: Safety: Goal: Ability to remain free from injury will improve 12/10/2022 1823 by Vincente Liberty, RN Outcome: Progressing 12/10/2022 1654 by Vincente Liberty, RN Outcome: Progressing   Problem: Skin Integrity: Goal: Risk for impaired skin integrity will decrease 12/10/2022 1823 by Vincente Liberty, RN Outcome: Progressing 12/10/2022 1654 by Vincente Liberty, RN Outcome: Progressing   Problem: Education: Goal: Ability to verbalize activity precautions or restrictions will improve 12/10/2022 1823 by Vincente Liberty, RN Outcome: Progressing 12/10/2022 1654 by Vincente Liberty, RN Outcome: Progressing Goal: Knowledge of the prescribed therapeutic regimen will improve 12/10/2022 1823 by Vincente Liberty, RN Outcome: Progressing 12/10/2022 1654 by Vincente Liberty, RN Outcome: Progressing Goal: Understanding of discharge needs will improve 12/10/2022 1823 by Vincente Liberty, RN Outcome: Progressing 12/10/2022 1654 by Vincente Liberty, RN Outcome: Progressing   Problem: Activity: Goal: Ability to avoid complications of mobility impairment will improve 12/10/2022 1823 by Vincente Liberty, RN Outcome: Progressing 12/10/2022 1654 by Vincente Liberty,  RN Outcome: Progressing Goal: Ability to tolerate increased activity will improve 12/10/2022 1823 by Vincente Liberty, RN Outcome: Progressing 12/10/2022 1654 by Vincente Liberty, RN Outcome: Progressing Goal: Will remain free from falls 12/10/2022 1823 by Vincente Liberty, RN Outcome: Progressing 12/10/2022 1654 by Vincente Liberty, RN Outcome: Progressing   Problem: Bowel/Gastric: Goal: Gastrointestinal status for postoperative course will improve 12/10/2022 1823 by Vincente Liberty, RN Outcome: Progressing 12/10/2022 1654 by Vincente Liberty, RN Outcome: Progressing   Problem: Clinical Measurements: Goal: Ability to maintain clinical measurements  within normal limits will improve 12/10/2022 1823 by Vincente Liberty, RN Outcome: Progressing 12/10/2022 1654 by Vincente Liberty, RN Outcome: Progressing Goal: Postoperative complications will be avoided or minimized 12/10/2022 1823 by Vincente Liberty, RN Outcome: Progressing 12/10/2022 1654 by Vincente Liberty, RN Outcome: Progressing Goal: Diagnostic test results will improve 12/10/2022 1823 by Vincente Liberty, RN Outcome: Progressing 12/10/2022 1654 by Vincente Liberty, RN Outcome: Progressing   Problem: Pain Management: Goal: Pain level will decrease 12/10/2022 1823 by Vincente Liberty, RN Outcome: Progressing 12/10/2022 1654 by Vincente Liberty, RN Outcome: Progressing   Problem: Skin Integrity: Goal: Will show signs of wound healing 12/10/2022 1823 by Vincente Liberty, RN Outcome: Progressing 12/10/2022 1654 by Vincente Liberty, RN Outcome: Progressing   Problem: Health Behavior/Discharge Planning: Goal: Identification of resources available to assist in meeting health care needs will improve 12/10/2022 1823 by Vincente Liberty, RN Outcome: Progressing 12/10/2022 1654 by Vincente Liberty, RN Outcome: Progressing   Problem: Bladder/Genitourinary: Goal: Urinary functional status for postoperative course  will improve 12/10/2022 1823 by Vincente Liberty, RN Outcome: Progressing 12/10/2022 1654 by Vincente Liberty, RN Outcome: Progressing

## 2022-12-10 NOTE — Plan of Care (Signed)

## 2022-12-10 NOTE — Anesthesia Procedure Notes (Signed)
Procedure Name: Intubation Date/Time: 12/10/2022 12:04 PM  Performed by: Sandie Ano, CRNAPre-anesthesia Checklist: Patient identified, Emergency Drugs available, Suction available and Patient being monitored Patient Re-evaluated:Patient Re-evaluated prior to induction Oxygen Delivery Method: Circle system utilized Preoxygenation: Pre-oxygenation with 100% oxygen Induction Type: IV induction Ventilation: Mask ventilation without difficulty Laryngoscope Size: Glidescope and 4 Grade View: Grade I Tube type: Oral Number of attempts: 1 Airway Equipment and Method: Stylet and Video-laryngoscopy Placement Confirmation: ETT inserted through vocal cords under direct vision, positive ETCO2 and breath sounds checked- equal and bilateral Secured at: 22 cm Tube secured with: Tape Dental Injury: Teeth and Oropharynx as per pre-operative assessment  Comments: Performed by SRNA with supervision from CRNA and MDA

## 2022-12-10 NOTE — Anesthesia Postprocedure Evaluation (Signed)
Anesthesia Post Note  Patient: BOYAN CHEEVER  Procedure(s) Performed: CERVICAL FOUR-FIVE, CERVICAL FIVE-SIX, CERVICAL SIX-SEVEN ANTERIOR CERVICAL DECOMPRESSION/DISCECTOMY FUSION     Patient location during evaluation: PACU Anesthesia Type: General Level of consciousness: awake and alert Pain management: pain level controlled Vital Signs Assessment: post-procedure vital signs reviewed and stable Respiratory status: spontaneous breathing, nonlabored ventilation, respiratory function stable and patient connected to nasal cannula oxygen Cardiovascular status: blood pressure returned to baseline and stable Postop Assessment: no apparent nausea or vomiting Anesthetic complications: no  No notable events documented.  Last Vitals:  Vitals:   12/10/22 1530 12/10/22 1545  BP: (!) 171/80 (!) 164/80  Pulse: 71 70  Resp: (!) 7 12  Temp:    SpO2: 99% 97%    Last Pain:  Vitals:   12/10/22 1515  TempSrc:   PainSc: Asleep                 Kennieth Rad

## 2022-12-10 NOTE — H&P (Signed)
Subjective:   Patient is a 77 y.o. male admitted for neck and R arm pain. The patient first presented to me with complaints of neck pain, shooting pains in the arm(s), and loss of strength of the arm(s). Onset of symptoms was several months ago. The pain is described as aching and occurs all day. The pain is rated severe, and is located in the neck and radiates to the RUE. The symptoms have been progressive. Symptoms are exacerbated by extending head backwards, and are relieved by none.  Previous work up includes MRI of cervical spine, results: spinal stenosis.  Past Medical History:  Diagnosis Date   Anxiety    Arthritis    Depression    Neuromuscular disorder (HCC)    Right Arm/Hand Neuropathy    Past Surgical History:  Procedure Laterality Date   COLONOSCOPY  2023   INGUINAL HERNIA REPAIR Left    2000s   TONSILLECTOMY     As a child    Allergies  Allergen Reactions   Simvastatin Other (See Comments)    Per PT joint pain and muscle pain    Social History   Tobacco Use   Smoking status: Former    Types: Cigarettes   Smokeless tobacco: Never   Tobacco comments:    Quit in [place] [id number]  Substance Use Topics   Alcohol use: Yes    Alcohol/week: 2.0 - 4.0 standard drinks of alcohol    Types: 2 - 4 Standard drinks or equivalent per week    History reviewed. No pertinent family history. Prior to Admission medications   Medication Sig Start Date End Date Taking? Authorizing Provider  acetaminophen (TYLENOL) 500 MG tablet Take 1,000 mg by mouth every 6 (six) hours as needed for mild pain (pain score 1-3) or moderate pain (pain score 4-6).   Yes [provider]  Ascorbic Acid (VITAMIN C ER PO) Take 500 mg by mouth daily.   Yes [provider]  aspirin 81 MG tablet Take 81 mg by mouth daily.   Yes [provider]  cholecalciferol (VITAMIN D3) 25 MCG (1000 UNIT) tablet Take 1,000 Units by mouth daily.   Yes [provider]  cyanocobalamin (VITAMIN  B12) 1000 MCG tablet Take 1,000 mcg by mouth daily.   Yes [provider]  escitalopram (LEXAPRO) 10 MG tablet Take 10 mg by mouth daily.   Yes [provider]  ibuprofen (ADVIL) 200 MG tablet Take 400 mg by mouth 3 (three) times daily as needed for mild pain (pain score 1-3) or moderate pain (pain score 4-6).   Yes [provider]  lidocaine (LIDODERM) 5 % Place 1 patch onto the skin daily. Remove & Discard patch within 12 hours or as directed by MD Patient taking differently: Place 1 patch onto the skin daily as needed (Back pain). Remove & Discard patch within 12 hours or as directed by MD 09/16/22  Yes Theresia Lo, Benetta Spar K, DO  lidocaine (LMX) 4 % cream Apply 1 Application topically daily as needed (Pain).   Yes [provider]  traMADol (ULTRAM) 50 MG tablet Take 50 mg by mouth every 6 (six) hours as needed for moderate pain (pain score 4-6) or severe pain (pain score 7-10). 10/30/22  Yes [provider]  Turmeric (CURCUMIN 95 PO) Take 500 mg by mouth daily.   Yes [provider]     Review of Systems  Positive ROS: neg  All other systems have been reviewed and were otherwise negative with the exception  of those mentioned in the HPI and as above.  Objective: Vital signs in last 24 hours: Temp:  [97.4 F (36.3 C)] 97.4 F (36.3 C) (11/18 1024) Pulse Rate:  [63] 63 (11/18 1024) Resp:  [18] 18 (11/18 1024) BP: (172)/(84) 172/84 (11/18 1024) SpO2:  [97 %] 97 % (11/18 1024) Weight:  [77.1 kg] 77.1 kg (11/18 1024)  General Appearance: Alert, cooperative, no distress, appears stated age Head: Normocephalic, without obvious abnormality, atraumatic Eyes: PERRL, conjunctiva/corneas clear, EOM's intact      Neck: Supple, symmetrical, trachea midline, Back: Symmetric, no curvature, ROM normal, no CVA tenderness Lungs:  respirations unlabored Heart: Regular rate and rhythm Abdomen: Soft, non-tender Extremities: Extremities normal,  atraumatic, no cyanosis or edema Pulses: 2+ and symmetric all extremities Skin: Skin color, texture, turgor normal, no rashes or lesions  NEUROLOGIC:  Mental status: Alert and oriented x4, no aphasia, good attention span, fund of knowledge and memory  Motor Exam - grossly normal Sensory Exam - grossly normal Reflexes: 1+ Coordination - grossly normal Gait - grossly normal Balance - grossly normal Cranial Nerves: I: smell Not tested  II: visual acuity  OS: nl    OD: nl  II: visual fields Full to confrontation  II: pupils Equal, round, reactive to light  III,VII: ptosis None  III,IV,VI: extraocular muscles  Full ROM  V: mastication Normal  V: facial light touch sensation  Normal  V,VII: corneal reflex  Present  VII: facial muscle function - upper  Normal  VII: facial muscle function - lower Normal  VIII: hearing Not tested  IX: soft palate elevation  Normal  IX,X: gag reflex Present  XI: trapezius strength  5/5  XI: sternocleidomastoid strength 5/5  XI: neck flexion strength  5/5  XII: tongue strength  Normal    Data Review Lab Results  Component Value Date   WBC 7.4 12/04/2022   HGB 13.5 12/04/2022   HCT 41.9 12/04/2022   MCV 93.3 12/04/2022   PLT 328 12/04/2022   Lab Results  Component Value Date   NA 139 12/04/2022   K 4.1 12/04/2022   CL 103 12/04/2022   CO2 28 12/04/2022   BUN 23 12/04/2022   CREATININE 0.98 12/04/2022   GLUCOSE 105 (H) 12/04/2022   Lab Results  Component Value Date   INR 1.0 12/04/2022    Assessment:   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 Estimated body mass index is 25.85 kg/m as calculated from the following:   Height as of this encounter: 5\' 8"  (1.727 m).   Weight as of this encounter: 77.1 kg.  Patient has failed conservative therapy. Planned surgery : ACDF with plate E9-5 M8-4 C6-7  Plan:   I explained the condition and procedure to the patient and answered any  questions.  Patient wishes to proceed with procedure as planned. Understands risks/ benefits/ and expected or typical outcomes.  Tia Alert 12/10/2022 10:43 AM

## 2022-12-10 NOTE — Op Note (Signed)
12/10/2022  2:01 PM  PATIENT:  Kyle Williamson  77 y.o. 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 Decompressive anterior cervical discectomy C4-5 C5-6 C6-7, 2. Anterior cervical arthrodesis C4-5 C5-6 C6-7 utilizing a PTI interbody cage packed with locally harvested morcellized autologous bone graft, 3. Anterior cervical plating C4-C7 inclusive utilizing a ATEC plate  SURGEON:  Marikay Alar, MD  ASSISTANTS: Verlin Dike, FNP  ANESTHESIA:   General  EBL: 200 ml  Total I/O In: 900 [I.V.:900] Out: 200 [Blood:200]  BLOOD ADMINISTERED: none  DRAINS: none  SPECIMEN:  none  INDICATION FOR PROCEDURE: This patient presented with cervical myeloradiculopathy. Imaging showed spinal stenosis C4-5 C5-6 C6-7. The patient tried conservative measures without relief. Pain was debilitating. Recommended ACDF with plating. Patient understood the risks, benefits, and alternatives and potential outcomes and wished to proceed.  PROCEDURE DETAILS: Patient was brought to the operating room placed under general endotracheal anesthesia. Patient was placed in the supine position on the operating room table. The neck was prepped with Duraprep and draped in a sterile fashion.   Three cc of local anesthesia was injected and a transverse incision was made on the right side of the neck.  Dissection was carried down thru the subcutaneous tissue and the platysma was  elevated, opened, and undermined with Metzenbaum scissors.  Dissection was then carried out thru an avascular plane leaving the sternocleidomastoid carotid artery and jugular vein laterally and the trachea and esophagus medially with the assistance of my nurse practitioner. The ventral aspect of the vertebral column was identified and a localizing x-ray was taken. The C4-5 C5-6 C6-7 level was identified and all in the  room agreed with the level. The longus colli muscles were then elevated and the retractor was placed with the assistance of my nurse practitioner. The annulus was incised and the disc space entered. Discectomy was performed with micro-curettes and pituitary rongeurs. I then used the high-speed drill to drill the endplates down to the level of the posterior longitudinal ligament. The drill shavings were saved in a mucous trap for later arthrodesis. The operating microscope was draped and brought into the field provided additional magnification, illumination and visualization. Discectomy was continued posteriorly thru the disc space. Posterior longitudinal ligament was opened with a nerve hook, and then removed along with disc herniation and osteophytes, decompressing the spinal canal and thecal sac. We then continued to remove osteophytic overgrowth and disc material decompressing the neural foramina and exiting nerve roots bilaterally. The scope was angled up and down to help decompress and undercut the vertebral bodies. Once the decompression was completed we could pass a nerve hook circumferentially to assure adequate decompression in the midline and in the neural foramina. So by both visualization and palpation we felt we had an adequate decompression of the neural elements. We then measured the height of the intravertebral disc space and selected a 6 millimeter PTI interbody cage packed with autograft and DBM putty. It was then gently positioned in the intravertebral disc space(s) and countersunk. I then used a 56 mm plate and placed 14 mm variable angle screws into the vertebral bodies of each level and locked them into position. The wound was irrigated with bacitracin solution, checked for hemostasis which was established and confirmed. Once meticulous hemostasis was achieved, we then proceeded with closure with the assistance of my nurse practitioner. The platysma was closed with interrupted 3-0 undyed  Vicryl  suture, the subcuticular layer was closed with interrupted 3-0 undyed Vicryl suture. The skin edges were approximated with steristrips. The drapes were removed. A sterile dressing was applied. The patient was then awakened from general anesthesia and transferred to the recovery room in stable condition. At the end of the procedure all sponge, needle and instrument counts were correct.   PLAN OF CARE: Admit to inpatient   PATIENT DISPOSITION:  PACU - hemodynamically stable.   Delay start of Pharmacological VTE agent (>24hrs) due to surgical blood loss or risk of bleeding:  yes

## 2022-12-10 NOTE — Transfer of Care (Signed)
Immediate Anesthesia Transfer of Care Note  Patient: Kyle Williamson  Procedure(s) Performed: CERVICAL FOUR-FIVE, CERVICAL FIVE-SIX, CERVICAL SIX-SEVEN ANTERIOR CERVICAL DECOMPRESSION/DISCECTOMY FUSION  Patient Location: PACU  Anesthesia Type:General  Level of Consciousness: drowsy and patient cooperative  Airway & Oxygen Therapy: Patient Spontanous Breathing and Patient connected to face mask oxygen  Post-op Assessment: Report given to RN and Post -op Vital signs reviewed and stable  Post vital signs: Reviewed and stable  Last Vitals:  Vitals Value Taken Time  BP 187/82 12/10/22 1415  Temp    Pulse 72 12/10/22 1416  Resp 25 12/10/22 1416  SpO2 100 % 12/10/22 1416  Vitals shown include unfiled device data.  Last Pain:  Vitals:   12/10/22 1045  TempSrc:   PainSc: 4       Patients Stated Pain Goal: 0 (12/10/22 1045)  Complications: No notable events documented.

## 2022-12-11 ENCOUNTER — Encounter (HOSPITAL_COMMUNITY): Payer: Self-pay | Admitting: Neurological Surgery

## 2022-12-11 MED ORDER — OXYCODONE-ACETAMINOPHEN 5-325 MG PO TABS: 1.0000 | ORAL_TABLET | ORAL | 0 refills | Status: DC | PRN

## 2022-12-11 MED ORDER — METHOCARBAMOL 750 MG PO TABS: 750.0000 mg | ORAL_TABLET | Freq: Four times a day (QID) | ORAL | 0 refills | Status: DC

## 2022-12-11 NOTE — Progress Notes (Signed)
Patient awaiting family for discharge home, Patient in no acute distress nor complaints of pain nor discomfort; incision on back is clean, dry and intact; No c/o pain at this time. Room was checked and accounted for all patient's belongings; discharge instructions concerning her medications, incision care, follow up appointment and when to call the doctor as needed were all discussed with patient by RN and she expressed understanding on the instructions given

## 2022-12-11 NOTE — Evaluation (Addendum)
Physical Therapy Evaluation  Patient Details Name: Kyle Williamson MRN: [id number] DOB: 1946-01-13 Today's Date: 12/11/2022  History of Present Illness  Pt is a 77 y/o M who presents s/p C4-C7 ACDF on 12/10/22. PMH includes anxiety, arthritis, depression  Clinical Impression  Pt admitted with above diagnosis. At the time of PT eval, pt was able to demonstrate bed mobility with mod I, transfers and ambulation with gross CGA and SPC for support. Pt given gait belt to take home for added safety with stair negotiation without railings. Pt was educated on precautions, positioning recommendations, appropriate activity progression, and car transfer. Pt currently with functional limitations due to the deficits listed below (see PT Problem List). Pt will benefit from skilled PT to increase their independence and safety with mobility to allow discharge to the venue listed below.          If plan is discharge home, recommend the following: A little help with walking and/or transfers;A little help with bathing/dressing/bathroom;Assistance with cooking/housework;Assist for transportation;Help with stairs or ramp for entrance   Can travel by private vehicle        Equipment Recommendations Cane  Recommendations for Other Services       Functional Status Assessment Patient has had a recent decline in their functional status and demonstrates the ability to make significant improvements in function in a reasonable and predictable amount of time.     Precautions / Restrictions Precautions Precautions: Cervical Precaution Booklet Issued: Yes (comment) Precaution Comments: Reviewed handout and pt was cued for precautions during functional mobility. Required Braces or Orthoses: Other Brace Other Brace: no brace needed per MD Restrictions Weight Bearing Restrictions: No      Mobility  Bed Mobility Overal bed mobility: Modified Independent Bed Mobility: Rolling, Sidelying to Sit, Sit to  Sidelying           General bed mobility comments: HOB flat and rails lowered to simulate home environment. No assist required, light cues for optimal log roll technique throughout.    Transfers Overall transfer level: Needs assistance Equipment used: Straight cane Transfers: Sit to/from Stand Sit to Stand: Contact guard assist           General transfer comment: Close guard for safety as pt powered up to full stand. No unsteadiness or LOB noted.    Ambulation/Gait Ambulation/Gait assistance: Contact guard assist Gait Distance (Feet): 300 Feet Assistive device: Straight cane Gait Pattern/deviations: Step-through pattern, Decreased stride length, Trunk flexed Gait velocity: Decreased Gait velocity interpretation: 1.31 - 2.62 ft/sec, indicative of limited community ambulator   General Gait Details: VC's for upright posture. Overall good use of cane.  Stairs Stairs: Yes Stairs assistance: Contact guard assist Stair Management: No rails, Step to pattern, Forwards, With cane Number of Stairs: 5 General stair comments: VC's for sequencing and general safety.  Wheelchair Mobility     Tilt Bed    Modified Rankin (Stroke Patients Only)       Balance Overall balance assessment: Needs assistance Sitting-balance support: Feet supported Sitting balance-Leahy Scale: Good     Standing balance support: During functional activity, Reliant on assistive device for balance Standing balance-Leahy Scale: Fair Standing balance comment: mild instability with use of SPC                             Pertinent Vitals/Pain Pain Assessment Pain Assessment: Faces Faces Pain Scale: Hurts little more Pain Location: shoulders and upper back/rhomboid area Pain Descriptors / Indicators:  Operative site guarding, Sore Pain Intervention(s): Limited activity within patient's tolerance, Monitored during session, Repositioned    Home Living Family/patient expects to be  discharged to:: Private residence Living Arrangements: Alone Available Help at Discharge: Family;Available PRN/intermittently (family coming over and staying every night for the first week) Type of Home: House Home Access: Stairs to enter Entrance Stairs-Rails: None Entrance Stairs-Number of Steps: 2-4   Home Layout: Two level;Able to live on main level with bedroom/bathroom Home Equipment: Shower seat;Cane - quad;Hand held shower head      Prior Function Prior Level of Function : Independent/Modified Independent             Mobility Comments: no AD use ADLs Comments: ind     Extremity/Trunk Assessment   Upper Extremity Assessment Upper Extremity Assessment: Defer to OT evaluation    Lower Extremity Assessment Lower Extremity Assessment: Generalized weakness    Cervical / Trunk Assessment Cervical / Trunk Assessment: Neck Surgery  Communication   Communication Communication: No apparent difficulties  Cognition Arousal: Alert Behavior During Therapy: WFL for tasks assessed/performed Overall Cognitive Status: Within Functional Limits for tasks assessed                                          General Comments General comments (skin integrity, edema, etc.): VSS    Exercises     Assessment/Plan    PT Assessment Patient needs continued PT services  PT Problem List Decreased strength;Decreased activity tolerance;Decreased balance;Decreased mobility;Decreased knowledge of use of DME;Decreased safety awareness;Decreased knowledge of precautions;Pain       PT Treatment Interventions DME instruction;Gait training;Stair training;Functional mobility training;Therapeutic activities;Therapeutic exercise;Balance training;Patient/family education    PT Goals (Current goals can be found in the Care Plan section)  Acute Rehab PT Goals Patient Stated Goal: Home at d/c PT Goal Formulation: With patient Time For Goal Achievement: 12/18/22 Potential to  Achieve Goals: Good    Frequency Min 5X/week     Co-evaluation               AM-PAC PT "6 Clicks" Mobility  Outcome Measure Help needed turning from your back to your side while in a flat bed without using bedrails?: None Help needed moving from lying on your back to sitting on the side of a flat bed without using bedrails?: None Help needed moving to and from a bed to a chair (including a wheelchair)?: A Little Help needed standing up from a chair using your arms (e.g., wheelchair or bedside chair)?: A Little Help needed to walk in hospital room?: A Little Help needed climbing 3-5 steps with a railing? : A Little 6 Click Score: 20    End of Session Equipment Utilized During Treatment: Gait belt Activity Tolerance: Patient tolerated treatment well Patient left: in bed;with call bell/phone within reach;with family/visitor present Nurse Communication: Mobility status PT Visit Diagnosis: Unsteadiness on feet (R26.81);Pain Pain - part of body: Shoulder    Time: 1031-1055 PT Time Calculation (min) (ACUTE ONLY): 24 min   Charges:   PT Evaluation $PT Eval Low Complexity: 1 Low PT Treatments $Gait Training: 8-22 mins PT General Charges $$ ACUTE PT VISIT: 1 Visit         Conni Slipper, PT, DPT Acute Rehabilitation Services Secure Chat Preferred Office: [phone]   Marylynn Pearson 12/11/2022, 12:38 PM

## 2022-12-11 NOTE — Evaluation (Signed)
Occupational Therapy Evaluation Patient Details Name: Kyle Williamson MRN: [id number] DOB: Jun 14, 1945 Today's Date: 12/11/2022   History of Present Illness Pt is a 77 y/o M s/p C4-5, C5-6, C6-7 ACDF on 11/18. PMH includes anxiety, arthritis, depression   Clinical Impression   Pt reports living alone, ind at baseline with ADLs/functional mobility, plans to have family/friends to come over daily to assist the first week. Pt currently needing set up -min A for ADLs, CGA for bed mobility and CGA for transfers with SPC, mild instability noted with short distance ambulation in room/ hall. Cervical precautions handout provided and pt educated on precautions and compensatory strategies for ADLs/mobility. Pt verbalized and demo's understanding during session, also discussed use of shower seat for safety once pt cleared to shower. Pt presenting with impairments listed below, will follow acutely. Anticipate no OT follow up needs at d/c.        If plan is discharge home, recommend the following: A little help with walking and/or transfers;A little help with bathing/dressing/bathroom;Assistance with cooking/housework;Assist for transportation    Functional Status Assessment  Patient has had a recent decline in their functional status and demonstrates the ability to make significant improvements in function in a reasonable and predictable amount of time.  Equipment Recommendations  None recommended by OT (pt has recommended shower seat)    Recommendations for Other Services PT consult     Precautions / Restrictions Precautions Precautions: Cervical Precaution Booklet Issued: Yes (comment) Precaution Comments: educated on cervical prec Required Braces or Orthoses: Other Brace Other Brace: no brace needed per MD Restrictions Weight Bearing Restrictions: No      Mobility Bed Mobility Overal bed mobility: Needs Assistance Bed Mobility: Supine to Sit, Rolling, Sidelying to Sit Rolling: Contact  guard assist Sidelying to sit: Contact guard assist            Transfers Overall transfer level: Needs assistance Equipment used: Straight cane Transfers: Sit to/from Stand Sit to Stand: Contact guard assist                  Balance Overall balance assessment: Needs assistance Sitting-balance support: Feet supported Sitting balance-Leahy Scale: Good     Standing balance support: During functional activity, Reliant on assistive device for balance Standing balance-Leahy Scale: Fair Standing balance comment: mild instability with use of SPC                           ADL either performed or assessed with clinical judgement   ADL Overall ADL's : Needs assistance/impaired Eating/Feeding: Set up   Grooming: Set up   Upper Body Bathing: Contact guard assist   Lower Body Bathing: Minimal assistance   Upper Body Dressing : Contact guard assist   Lower Body Dressing: Minimal assistance   Toilet Transfer: Contact guard assist;Ambulation   Toileting- Clothing Manipulation and Hygiene: Contact guard assist       Functional mobility during ADLs: Contact guard assist;Cane       Vision Baseline Vision/History: 1 Wears glasses Vision Assessment?: No apparent visual deficits     Perception Perception: Not tested       Praxis Praxis: Not tested       Pertinent Vitals/Pain Pain Assessment Pain Assessment: No/denies pain     Extremity/Trunk Assessment Upper Extremity Assessment Upper Extremity Assessment: Generalized weakness   Lower Extremity Assessment Lower Extremity Assessment: Defer to PT evaluation   Cervical / Trunk Assessment Cervical / Trunk Assessment: Neck Surgery  Communication Communication Communication: No apparent difficulties   Cognition Arousal: Alert Behavior During Therapy: WFL for tasks assessed/performed Overall Cognitive Status: Within Functional Limits for tasks assessed                                  General Comments: decr recall of precautions at times     General Comments  VSS    Exercises     Shoulder Instructions      Home Living Family/patient expects to be discharged to:: Private residence Living Arrangements: Alone Available Help at Discharge: Family;Available PRN/intermittently (family coming over and staying every night for the first week) Type of Home: House Home Access: Stairs to enter [employer] of Steps: 2-4 Entrance Stairs-Rails: None Home Layout: Two level;Able to live on main level with bedroom/bathroom     Bathroom Shower/Tub: Chief Strategy Officer: Standard     Home Equipment: Shower seat;Cane - quad;Hand held shower head          Prior Functioning/Environment Prior Level of Function : Independent/Modified Independent             Mobility Comments: no AD use ADLs Comments: ind        OT Problem List: Decreased strength;Decreased range of motion;Decreased activity tolerance;Impaired balance (sitting and/or standing);Decreased knowledge of precautions      OT Treatment/Interventions: Self-care/ADL training;Therapeutic exercise;DME and/or AE instruction;Energy conservation;Therapeutic activities;Patient/family education;Balance training    OT Goals(Current goals can be found in the care plan section) Acute Rehab OT Goals Patient Stated Goal: none stated OT Goal Formulation: With patient Time For Goal Achievement: 12/25/22 Potential to Achieve Goals: Good  OT Frequency: Min 1X/week    Co-evaluation              AM-PAC OT "6 Clicks" Daily Activity     Outcome Measure Help from another person eating meals?: None Help from another person taking care of personal grooming?: A Little Help from another person toileting, which includes using toliet, bedpan, or urinal?: A Little Help from another person bathing (including washing, rinsing, drying)?: A Little Help from another person to put on and taking off  regular upper body clothing?: A Little Help from another person to put on and taking off regular lower body clothing?: A Little 6 Click Score: 19   End of Session Equipment Utilized During Treatment:  (cane) Nurse Communication: Mobility status  Activity Tolerance: Patient tolerated treatment well Patient left: in bed;with call bell/phone within reach;with family/visitor present  OT Visit Diagnosis: Unsteadiness on feet (R26.81);Other abnormalities of gait and mobility (R26.89);Muscle weakness (generalized) (M62.81)                Time: 3244-0102 OT Time Calculation (min): 37 min Charges:  OT General Charges $OT Visit: 1 Visit OT Evaluation $OT Eval Low Complexity: 1 Low OT Treatments $Self Care/Home Management : 8-22 mins  Carver Fila, OTD, OTR/L SecureChat Preferred Acute Rehab [phone]   Carver Fila Koonce 12/11/2022, 10:35 AM

## 2022-12-11 NOTE — Plan of Care (Signed)
  Problem: Education: Goal: Knowledge of General Education information will improve Description: Including pain rating scale, medication(s)/side effects and non-pharmacologic comfort measures Outcome: Completed/Met   Problem: Health Behavior/Discharge Planning: Goal: Ability to manage health-related needs will improve Outcome: Completed/Met   Problem: Clinical Measurements: Goal: Ability to maintain clinical measurements within normal limits will improve Outcome: Completed/Met Goal: Will remain free from infection Outcome: Completed/Met Goal: Diagnostic test results will improve Outcome: Completed/Met Goal: Respiratory complications will improve Outcome: Completed/Met Goal: Cardiovascular complication will be avoided Outcome: Completed/Met   Problem: Activity: Goal: Risk for activity intolerance will decrease Outcome: Completed/Met   Problem: Nutrition: Goal: Adequate nutrition will be maintained Outcome: Completed/Met   Problem: Coping: Goal: Level of anxiety will decrease Outcome: Completed/Met   Problem: Elimination: Goal: Will not experience complications related to bowel motility Outcome: Completed/Met Goal: Will not experience complications related to urinary retention Outcome: Completed/Met   Problem: Pain Management: Goal: General experience of comfort will improve Outcome: Completed/Met   Problem: Safety: Goal: Ability to remain free from injury will improve Outcome: Completed/Met   Problem: Skin Integrity: Goal: Risk for impaired skin integrity will decrease Outcome: Completed/Met   Problem: Education: Goal: Ability to verbalize activity precautions or restrictions will improve Outcome: Completed/Met Goal: Knowledge of the prescribed therapeutic regimen will improve Outcome: Completed/Met Goal: Understanding of discharge needs will improve Outcome: Completed/Met   Problem: Activity: Goal: Ability to avoid complications of mobility impairment will  improve Outcome: Completed/Met Goal: Ability to tolerate increased activity will improve Outcome: Completed/Met Goal: Will remain free from falls Outcome: Completed/Met   Problem: Bowel/Gastric: Goal: Gastrointestinal status for postoperative course will improve Outcome: Completed/Met   Problem: Clinical Measurements: Goal: Ability to maintain clinical measurements within normal limits will improve Outcome: Completed/Met Goal: Postoperative complications will be avoided or minimized Outcome: Completed/Met Goal: Diagnostic test results will improve Outcome: Completed/Met   Problem: Pain Management: Goal: Pain level will decrease Outcome: Completed/Met   Problem: Skin Integrity: Goal: Will show signs of wound healing Outcome: Completed/Met

## 2022-12-11 NOTE — Discharge Summary (Signed)
Physician Discharge Summary  Patient ID: Kyle Williamson MRN: [id number] DOB/AGE: Apr 25, 1945 77 y.o.  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 The patient tolerated the procedure well and was taken to the recovery room and then to the floor in stable condition. The hospital course was routine. There were no complications. The wound remained clean dry and intact. Pt had appropriate neck soreness. No complaints of  arm pain or new N/T/W. The patient remained afebrile with stable vital signs, and tolerated a regular diet. The patient continued to increase activities, and pain was well controlled with oral pain medications.   Consults: None  Significant Diagnostic Studies:  Results for orders placed or performed during the hospital encounter of 12/04/22  Surgical pcr screen   Specimen: Nasal Mucosa; Nasal Swab  Result Value Ref Range   MRSA, PCR NEGATIVE NEGATIVE   Staphylococcus aureus NEGATIVE NEGATIVE  Protime-INR  Result Value Ref Range   Prothrombin Time 13.7 11.4 - 15.2 seconds   INR 1.0 0.8 - 1.2  Basic metabolic panel per protocol  Result Value Ref Range   Sodium 139 135 - 145 mmol/L   Potassium 4.1 3.5 - 5.1 mmol/L   Chloride 103 98 - 111 mmol/L   CO2 28 22 - 32 mmol/L   Glucose, Bld 105 (H) 70 - 99 mg/dL   BUN 23 8 - 23 mg/dL   Creatinine, Ser 2.44 0.61 - 1.24 mg/dL   Calcium 9.2 8.9 - 01.0 mg/dL   GFR, Estimated >27 >25 mL/min   Anion gap 8 5 - 15  CBC per protocol  Result Value Ref Range   WBC 7.4 4.0 - 10.5 K/uL   RBC 4.49 4.22 - 5.81 MIL/uL   Hemoglobin 13.5 13.0 - 17.0 g/dL   HCT 36.6 44.0 - 34.7 %   MCV 93.3 80.0 - 100.0 fL   MCH 30.1 26.0 - 34.0 pg   MCHC 32.2 30.0 - 36.0 g/dL   RDW 42.5  95.6 - 38.7 %   Platelets 328 150 - 400 K/uL   nRBC 0.0 0.0 - 0.2 %  Type and screen [facility]  Result Value Ref Range   ABO/RH(D) O POS    Antibody Screen NEG    Sample Expiration 12/18/2022,2359    Extend sample reason      NO TRANSFUSIONS OR PREGNANCY IN THE PAST 3 MONTHS Performed at [facility], [address] [place], [place] [address]     DG Cervical Spine 2 or 3 views  Result Date: 12/10/2022 CLINICAL DATA:  Cervical fusion EXAM: CERVICAL SPINE - 2-3 VIEW COMPARISON:  11/13/2022 FINDINGS: Four C-arm fluoroscopic images were obtained intraoperatively and submitted for post operative interpretation. Images demonstrate C4-C7 ACDF hardware which appears well positioned. Endotracheal tube is partially imaged. 14 seconds of fluoroscopy time utilized. Radiation dose: 1.17 mGy. Please see the performing provider's procedural report for further detail. IMPRESSION: Intraoperative fluoroscopic guidance for C4-C7 ACDF. Electronically Signed   By: Duanne Guess D.O.   On: 12/10/2022 18:12   DG C-Arm 1-60 Min-No Report  Result Date: 12/10/2022 Fluoroscopy was utilized by the requesting physician.  No radiographic interpretation.   DG C-Arm 1-60 Min-No Report  Result Date: 12/10/2022 Fluoroscopy was utilized by the requesting physician.  No radiographic  interpretation.   DG C-Arm 1-60 Min-No Report  Result Date: 12/10/2022 Fluoroscopy was utilized by the requesting physician.  No radiographic interpretation.    Antibiotics:  Anti-infectives (From admission, onward)    Start     Dose/Rate Route Frequency Ordered Stop   12/10/22 2000  ceFAZolin (ANCEF) IVPB 2g/100 mL premix        2 g 200 mL/hr over 30 Minutes Intravenous Every 8 hours 12/10/22 1547 12/11/22 0328   12/10/22 1045  ceFAZolin (ANCEF) IVPB 2g/100 mL premix        2 g 200 mL/hr over 30 Minutes Intravenous On call to O.R. 12/10/22 1038 12/10/22 1212   12/10/22 1041  ceFAZolin (ANCEF) 2-4  GM/100ML-% IVPB       Note to Pharmacy: Remus Blake: cabinet override      12/10/22 1041 12/10/22 1212       Discharge Exam: Blood pressure (!) 147/79, pulse 78, temperature 98.1 F (36.7 C), temperature source Oral, resp. rate 20, height 5\' 8"  (1.727 m), weight 77.1 kg, SpO2 98%. Neurologic: Grossly normal Dressing dry  Discharge Medications:   Allergies as of 12/11/2022       Reactions   Simvastatin Other (See Comments)   Per PT joint pain and muscle pain        Medication List     STOP taking these medications    traMADol 50 MG tablet Commonly known as: ULTRAM       TAKE these medications    acetaminophen 500 MG tablet Commonly known as: TYLENOL Take 1,000 mg by mouth every 6 (six) hours as needed for mild pain (pain score 1-3) or moderate pain (pain score 4-6).   aspirin 81 MG tablet Take 81 mg by mouth daily.   cholecalciferol 25 MCG (1000 UNIT) tablet Commonly known as: VITAMIN D3 Take 1,000 Units by mouth daily.   CURCUMIN 95 PO Take 500 mg by mouth daily.   cyanocobalamin 1000 MCG tablet Commonly known as: VITAMIN B12 Take 1,000 mcg by mouth daily.   escitalopram 10 MG tablet Commonly known as: LEXAPRO Take 10 mg by mouth daily.   ibuprofen 200 MG tablet Commonly known as: ADVIL Take 400 mg by mouth 3 (three) times daily as needed for mild pain (pain score 1-3) or moderate pain (pain score 4-6).   lidocaine 4 % cream Commonly known as: LMX Apply 1 Application topically daily as needed (Pain). What changed: Another medication with the same name was changed. Make sure you understand how and when to take each.   lidocaine 5 % Commonly known as: Lidoderm Place 1 patch onto the skin daily. Remove & Discard patch within 12 hours or as directed by MD What changed:  when to take this reasons to take this   methocarbamol 750 MG tablet Commonly known as: Robaxin-750 Take 1 tablet (750 mg total) by mouth 4 (four) times daily.    oxyCODONE-acetaminophen 5-325 MG tablet Commonly known as: PERCOCET/ROXICET Take 1 tablet by mouth every 4 (four) hours as needed for severe pain (pain score 7-10).   VITAMIN C ER PO Take 500 mg by mouth daily.        Disposition: home   Final Dx: ACDF C4-5 C5-6 C6-7  Discharge Instructions      Remove dressing in 72 hours   Complete by: As directed    Call MD for:   Complete by: As directed    Call MD for:  difficulty breathing, headache or visual disturbances   Complete by:  As directed    Call MD for:  hives   Complete by: As directed    Call MD for:  persistant dizziness or light-headedness   Complete by: As directed    Call MD for:  persistant nausea and vomiting   Complete by: As directed    Call MD for:  redness, tenderness, or signs of infection (pain, swelling, redness, odor or green/yellow discharge around incision site)   Complete by: As directed    Call MD for:  severe uncontrolled pain   Complete by: As directed    Call MD for:  temperature >100.4   Complete by: As directed    Diet - low sodium heart healthy   Complete by: As directed    Driving Restrictions   Complete by: As directed    No driving for 2 weeks, no riding in the car for 1 week   Increase activity slowly   Complete by: As directed    Lifting restrictions   Complete by: As directed    No lifting more than 8 lbs          Signed: Tia Alert 12/11/2022, 7:41 AM

## 2023-01-21 DIAGNOSIS — M5412 Radiculopathy, cervical region: Secondary | ICD-10-CM | POA: Diagnosis not present

## 2023-01-21 DIAGNOSIS — Z4789 Encounter for other orthopedic aftercare: Secondary | ICD-10-CM | POA: Diagnosis not present

## 2023-01-24 DIAGNOSIS — M5412 Radiculopathy, cervical region: Secondary | ICD-10-CM | POA: Diagnosis not present

## 2023-01-24 DIAGNOSIS — Z4789 Encounter for other orthopedic aftercare: Secondary | ICD-10-CM | POA: Diagnosis not present

## 2023-02-07 DIAGNOSIS — M5412 Radiculopathy, cervical region: Secondary | ICD-10-CM | POA: Diagnosis not present

## 2023-02-07 DIAGNOSIS — Z4789 Encounter for other orthopedic aftercare: Secondary | ICD-10-CM | POA: Diagnosis not present

## 2023-02-11 DIAGNOSIS — M5412 Radiculopathy, cervical region: Secondary | ICD-10-CM | POA: Diagnosis not present

## 2023-02-11 DIAGNOSIS — Z4789 Encounter for other orthopedic aftercare: Secondary | ICD-10-CM | POA: Diagnosis not present

## 2023-02-12 DIAGNOSIS — M5412 Radiculopathy, cervical region: Secondary | ICD-10-CM | POA: Diagnosis not present

## 2023-02-14 DIAGNOSIS — M5412 Radiculopathy, cervical region: Secondary | ICD-10-CM | POA: Diagnosis not present

## 2023-02-14 DIAGNOSIS — Z4789 Encounter for other orthopedic aftercare: Secondary | ICD-10-CM | POA: Diagnosis not present

## 2023-02-18 DIAGNOSIS — M5412 Radiculopathy, cervical region: Secondary | ICD-10-CM | POA: Diagnosis not present

## 2023-02-18 DIAGNOSIS — Z4789 Encounter for other orthopedic aftercare: Secondary | ICD-10-CM | POA: Diagnosis not present

## 2023-02-21 DIAGNOSIS — M5412 Radiculopathy, cervical region: Secondary | ICD-10-CM | POA: Diagnosis not present

## 2023-02-21 DIAGNOSIS — Z4789 Encounter for other orthopedic aftercare: Secondary | ICD-10-CM | POA: Diagnosis not present

## 2023-02-25 DIAGNOSIS — Z4789 Encounter for other orthopedic aftercare: Secondary | ICD-10-CM | POA: Diagnosis not present

## 2023-02-25 DIAGNOSIS — M5412 Radiculopathy, cervical region: Secondary | ICD-10-CM | POA: Diagnosis not present

## 2023-02-28 DIAGNOSIS — Z4789 Encounter for other orthopedic aftercare: Secondary | ICD-10-CM | POA: Diagnosis not present

## 2023-02-28 DIAGNOSIS — M5412 Radiculopathy, cervical region: Secondary | ICD-10-CM | POA: Diagnosis not present

## 2023-03-04 DIAGNOSIS — M5412 Radiculopathy, cervical region: Secondary | ICD-10-CM | POA: Diagnosis not present

## 2023-03-04 DIAGNOSIS — Z4789 Encounter for other orthopedic aftercare: Secondary | ICD-10-CM | POA: Diagnosis not present

## 2023-03-07 DIAGNOSIS — M5412 Radiculopathy, cervical region: Secondary | ICD-10-CM | POA: Diagnosis not present

## 2023-03-07 DIAGNOSIS — Z4789 Encounter for other orthopedic aftercare: Secondary | ICD-10-CM | POA: Diagnosis not present

## 2023-03-11 DIAGNOSIS — M5412 Radiculopathy, cervical region: Secondary | ICD-10-CM | POA: Diagnosis not present

## 2023-03-11 DIAGNOSIS — Z4789 Encounter for other orthopedic aftercare: Secondary | ICD-10-CM | POA: Diagnosis not present

## 2023-03-18 DIAGNOSIS — M5412 Radiculopathy, cervical region: Secondary | ICD-10-CM | POA: Diagnosis not present

## 2023-03-18 DIAGNOSIS — Z4789 Encounter for other orthopedic aftercare: Secondary | ICD-10-CM | POA: Diagnosis not present

## 2023-03-19 DIAGNOSIS — C44319 Basal cell carcinoma of skin of other parts of face: Secondary | ICD-10-CM | POA: Diagnosis not present

## 2023-03-19 DIAGNOSIS — C44711 Basal cell carcinoma of skin of unspecified lower limb, including hip: Secondary | ICD-10-CM | POA: Diagnosis not present

## 2023-03-19 DIAGNOSIS — Z85828 Personal history of other malignant neoplasm of skin: Secondary | ICD-10-CM | POA: Diagnosis not present

## 2023-03-19 DIAGNOSIS — L821 Other seborrheic keratosis: Secondary | ICD-10-CM | POA: Diagnosis not present

## 2023-03-19 DIAGNOSIS — C44619 Basal cell carcinoma of skin of left upper limb, including shoulder: Secondary | ICD-10-CM | POA: Diagnosis not present

## 2023-03-19 DIAGNOSIS — L218 Other seborrheic dermatitis: Secondary | ICD-10-CM | POA: Diagnosis not present

## 2023-03-19 DIAGNOSIS — L570 Actinic keratosis: Secondary | ICD-10-CM | POA: Diagnosis not present

## 2023-03-21 DIAGNOSIS — Z4789 Encounter for other orthopedic aftercare: Secondary | ICD-10-CM | POA: Diagnosis not present

## 2023-03-21 DIAGNOSIS — M5412 Radiculopathy, cervical region: Secondary | ICD-10-CM | POA: Diagnosis not present

## 2023-03-26 DIAGNOSIS — Z4789 Encounter for other orthopedic aftercare: Secondary | ICD-10-CM | POA: Diagnosis not present

## 2023-03-26 DIAGNOSIS — M5412 Radiculopathy, cervical region: Secondary | ICD-10-CM | POA: Diagnosis not present

## 2023-03-28 DIAGNOSIS — M5412 Radiculopathy, cervical region: Secondary | ICD-10-CM | POA: Diagnosis not present

## 2023-03-28 DIAGNOSIS — Z4789 Encounter for other orthopedic aftercare: Secondary | ICD-10-CM | POA: Diagnosis not present

## 2023-04-02 DIAGNOSIS — M5412 Radiculopathy, cervical region: Secondary | ICD-10-CM | POA: Diagnosis not present

## 2023-04-02 DIAGNOSIS — Z4789 Encounter for other orthopedic aftercare: Secondary | ICD-10-CM | POA: Diagnosis not present

## 2023-04-05 DIAGNOSIS — M5412 Radiculopathy, cervical region: Secondary | ICD-10-CM | POA: Diagnosis not present

## 2023-04-05 DIAGNOSIS — Z4789 Encounter for other orthopedic aftercare: Secondary | ICD-10-CM | POA: Diagnosis not present

## 2023-04-09 DIAGNOSIS — M5412 Radiculopathy, cervical region: Secondary | ICD-10-CM | POA: Diagnosis not present

## 2023-04-09 DIAGNOSIS — Z4789 Encounter for other orthopedic aftercare: Secondary | ICD-10-CM | POA: Diagnosis not present

## 2023-04-11 DIAGNOSIS — M5412 Radiculopathy, cervical region: Secondary | ICD-10-CM | POA: Diagnosis not present

## 2023-04-11 DIAGNOSIS — Z4789 Encounter for other orthopedic aftercare: Secondary | ICD-10-CM | POA: Diagnosis not present

## 2023-04-16 DIAGNOSIS — Z4789 Encounter for other orthopedic aftercare: Secondary | ICD-10-CM | POA: Diagnosis not present

## 2023-04-16 DIAGNOSIS — M5412 Radiculopathy, cervical region: Secondary | ICD-10-CM | POA: Diagnosis not present

## 2023-04-18 DIAGNOSIS — M5412 Radiculopathy, cervical region: Secondary | ICD-10-CM | POA: Diagnosis not present

## 2023-04-18 DIAGNOSIS — Z4789 Encounter for other orthopedic aftercare: Secondary | ICD-10-CM | POA: Diagnosis not present

## 2023-05-23 DIAGNOSIS — I480 Paroxysmal atrial fibrillation: Secondary | ICD-10-CM

## 2023-05-23 HISTORY — DX: Paroxysmal atrial fibrillation: I48.0

## 2023-05-29 DIAGNOSIS — K219 Gastro-esophageal reflux disease without esophagitis: Secondary | ICD-10-CM | POA: Diagnosis not present

## 2023-05-29 DIAGNOSIS — E782 Mixed hyperlipidemia: Secondary | ICD-10-CM | POA: Diagnosis not present

## 2023-05-29 DIAGNOSIS — R079 Chest pain, unspecified: Secondary | ICD-10-CM | POA: Diagnosis not present

## 2023-06-04 DIAGNOSIS — M5412 Radiculopathy, cervical region: Secondary | ICD-10-CM | POA: Diagnosis not present

## 2023-06-05 ENCOUNTER — Ambulatory Visit

## 2023-06-05 ENCOUNTER — Encounter: Payer: Self-pay | Admitting: Cardiology

## 2023-06-05 ENCOUNTER — Ambulatory Visit: Attending: Cardiology | Admitting: Cardiology

## 2023-06-05 VITALS — BP 126/70 | HR 73 | Ht 68.0 in | Wt 159.0 lb

## 2023-06-05 DIAGNOSIS — I4510 Unspecified right bundle-branch block: Secondary | ICD-10-CM | POA: Diagnosis not present

## 2023-06-05 DIAGNOSIS — R002 Palpitations: Secondary | ICD-10-CM

## 2023-06-05 DIAGNOSIS — Z01812 Encounter for preprocedural laboratory examination: Secondary | ICD-10-CM

## 2023-06-05 DIAGNOSIS — Z01810 Encounter for preprocedural cardiovascular examination: Secondary | ICD-10-CM | POA: Diagnosis not present

## 2023-06-05 DIAGNOSIS — R072 Precordial pain: Secondary | ICD-10-CM

## 2023-06-05 MED ORDER — METOPROLOL TARTRATE 100 MG PO TABS: 100.0000 mg | ORAL_TABLET | Freq: Once | ORAL | 0 refills | Status: DC

## 2023-06-05 MED ORDER — NITROGLYCERIN 0.4 MG SL SUBL: 0.4000 mg | SUBLINGUAL_TABLET | SUBLINGUAL | 3 refills | Status: AC | PRN

## 2023-06-05 NOTE — Patient Instructions (Addendum)
 Medication Instructions:  - START Nitroglycerin 0.4 MG under the tongue every 5 minutes, as needed for chest pain. Max dose of 3 within 15 minutes. If pain not relieved after third dose CALL 911.   *If you need a refill on your cardiac medications before your next appointment, please call your pharmacy*   Lab Work: BMET CBC   If you have labs (blood work) drawn today and your tests are completely normal, you will receive your results only by: MyChart Message (if you have MyChart) OR A paper copy in the mail If you have any lab test that is abnormal or we need to change your treatment, we will call you to review the results.   Testing/Procedures:   Your cardiac CT will be scheduled at one of the below locations:   Jeralene Mom. [facility] and Vascular Tower [address]  [place], [place] [address] Opening May 20, 2023  If scheduled at the Heart and Vascular Tower at [employer], please enter the parking lot using the [address] street entrance and use the FREE valet service at the patient drop-off area. Enter the buidling and check-in with registration on the main floor.  Please follow these instructions carefully (unless otherwise directed):  An IV will be required for this test and Nitroglycerin will be given.  Hold all erectile dysfunction medications at least 3 days (72 hrs) prior to test. (Ie viagra, cialis, sildenafil, tadalafil, etc)   On the Night Before the Test: Be sure to Drink plenty of water. Do not consume any caffeinated/decaffeinated beverages or chocolate 12 hours prior to your test. Do not take any antihistamines 12 hours prior to your test.  On the Day of the Test: Drink plenty of water until 1 hour prior to the test. Do not eat any food 1 hour prior to test. You may take your regular medications prior to the test.  Take metoprolol (Lopressor) 100 mg two hours prior to test.  After the Test: Drink plenty of water. After receiving IV contrast, you may  experience a mild flushed feeling. This is normal. On occasion, you may experience a mild rash up to 24 hours after the test. This is not dangerous. If this occurs, you can take Benadryl 25 mg, Zyrtec, Claritin, or Allegra and increase your fluid intake. (Patients taking Tikosyn should avoid Benadryl, and may take Zyrtec, Claritin, or Allegra) If you experience trouble breathing, this can be serious. If it is severe call 911 IMMEDIATELY. If it is mild, please call our office.  We will call to schedule your test 2-4 weeks out understanding that some insurance companies will need an authorization prior to the service being performed.   For more information and frequently asked questions, please visit our website : [email or web address]  For non-scheduling related questions, please contact the cardiac imaging nurse navigator should you have any questions/concerns: Cardiac Imaging Nurse Navigators Direct Office Dial: [phone]   For scheduling needs, including cancellations and rescheduling, please call [place], [phone].   ZIO XT- Long Term Monitor Instructions  Your physician has requested you wear a ZIO patch monitor for 14 days.  This is a single patch monitor. [employer] supplies one patch monitor per enrollment. Additional stickers are not available. Please do not apply patch if you will be having a Nuclear Stress Test,  Echocardiogram, Cardiac CT, MRI, or Chest Xray during the period you would be wearing the  monitor. The patch cannot be worn during these tests. You cannot remove and re-apply the  ZIO XT  patch monitor.  Your ZIO patch monitor will be mailed 3 day USPS to your address on file. It may take 3-5 days  to receive your monitor after you have been enrolled.  Once you have received your monitor, please review the enclosed instructions. Your monitor  has already been registered assigning a specific monitor serial # to you.  Billing and Patient Assistance Program  Information  We have supplied [employer] with any of your insurance information on file for billing purposes. [employer] offers a sliding scale Patient Assistance Program for patients that do not have  insurance, or whose insurance does not completely cover the cost of the ZIO monitor.  You must apply for the Patient Assistance Program to qualify for this discounted rate.  To apply, please call [employer] at [phone], select option 4, select option 2, ask to apply for  Patient Assistance Program. Sanna Crystal will ask your household income, and how many people  are in your household. They will quote your out-of-pocket cost based on that information.  [employer] will also be able to set up a 53-month, interest-free payment plan if needed.  Applying the monitor   Shave hair from upper left chest.  Hold abrader disc by orange tab. Rub abrader in 40 strokes over the upper left chest as  indicated in your monitor instructions.  Clean area with 4 enclosed alcohol pads. Let dry.  Apply patch as indicated in monitor instructions. Patch will be placed under collarbone on left  side of chest with arrow pointing upward.  Rub patch adhesive wings for 2 minutes. Remove white label marked "1". Remove the white  label marked "2". Rub patch adhesive wings for 2 additional minutes.  While looking in a mirror, press and release button in center of patch. A small green light will  flash 3-4 times. This will be your only indicator that the monitor has been turned on.  Do not shower for the first 24 hours. You may shower after the first 24 hours.  Press the button if you feel a symptom. You will hear a small click. Record Date, Time and  Symptom in the Patient Logbook.  When you are ready to remove the patch, follow instructions on the last 2 pages of Patient  Logbook. Stick patch monitor onto the last page of Patient Logbook.  Place Patient Logbook in the blue and white box. Use locking tab on box and tape box closed   securely. The blue and white box has prepaid postage on it. Please place it in the mailbox as  soon as possible. Your physician should have your test results approximately 7 days after the  monitor has been mailed back to [facility].  Call [facility] Customer Care at [phone] if you have questions regarding  your ZIO XT patch monitor. Call them immediately if you see an orange light blinking on your  monitor.  If your monitor falls off in less than 4 days, contact our Monitor department at [phone].  If your monitor becomes loose or falls off after 4 days call [employer] at [phone] for  suggestions on securing your monitor    Follow-Up: At [facility], you and your health needs are our priority.  As part of our continuing mission to provide you with exceptional heart care, we have created designated Provider Care Teams.  These Care Teams include your primary Cardiologist (physician) and Advanced Practice Providers (APPs -  Physician Assistants and Nurse Practitioners) who all work together to provide you with the care  you need, when you need it.  We recommend signing up for the patient portal called "MyChart".  Sign up information is provided on this After Visit Summary.  MyChart is used to connect with patients for Virtual Visits (Telemedicine).  Patients are able to view lab/test results, encounter notes, upcoming appointments, etc.  Non-urgent messages can be sent to your provider as well.   To learn more about what you can do with MyChart, go to [email or web address].    Your next appointment:   6 week(s)  The format for your next appointment:   In Person  Provider:   Randene Bustard, MD    Other Instructions

## 2023-06-05 NOTE — Progress Notes (Unsigned)
 Cardiology Office Note:  .   Date:  06/06/2023  ID:  Rochell Chroman, DOB October 26, 1945, MRN [id number] PCP: Roselind Congo, MD  [facility] Providers Cardiologist:  None     Chief Complaint  Patient presents with   New Patient (Initial Visit)    DOE/Precordial Pain & palpitations    Patient Profile: .     JOHNJOSEPH HEAPHY is a  78 y.o. male former smoker with a PMH notable for HLD, OSA  who presents here for Evaluation of Epigastric/Chest Pain with abnormal EKG at the request of Roselind Congo, MD. Chuckie Craven Tu, DO     Niko R Diane was recently seen by Sr. Tu on 05/29/2023 for evaluation of the setting consistent with GERD however but symptoms were more midsternal.  Mostly noted to be intermittent and associated with eating food.  PCP note indicated that he had a bad episode after eating greasy chicken.  No noticeable improvement with Tums.  He was started on pantoprazole which given the RBBB on EKG was referred to cardiology.  Subjective  Discussed the use of AI scribe software for clinical note transcription with the patient, who gave verbal consent to proceed.  History of Present Illness THEORY IMIG is a 78 year old male who presents with chest discomfort and palpitations. He was referred by Dr. Fleet Huh for evaluation of chest discomfort and an abnormal EKG.  He has been experiencing chest discomfort for approximately three weeks, described as a burning sensation that sometimes feels constrictive, affecting his chest and throat, and occasionally radiating to his esophagus and jaw. The symptoms have been intermittent, with a particularly severe episode occurring the day before the visit, following a meal. The discomfort does not worsen with physical activity such as walking or climbing stairs, but it sometimes eases when he lies down. He has not identified a consistent trigger, although stress and eating seem to exacerbate the symptoms at times.  He  experiences palpitations, describing them as feeling like his heart is 'not beating steadily' and 'missing a beat or two'. These episodes have occurred three to four times over the past few weeks, lasting up to 30 minutes. He feels the irregularity more in his pulse than in his chest. No associated symptoms such as nausea, significant shortness of breath, or swelling in his legs, although he reports occasional shortness of breath during daily activities and a sensation of being 'wobbly' after standing up from a bent position.  His past medical history includes a cervical fusion surgery following a fall last autumn, which resulted in decreased strength in his right arm and hand. 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 He previously took simvastatin for cholesterol but discontinued it due to muscle and joint pain. He is not currently on any medication for blood pressure or cholesterol.  His wife is in a memory care center, which has been a source of stress.   Objective   Mother had CHF and hyperlipidemia as well as CAD and DM-2.  Paternal grandmother had DM, maternal grandmother had HTN and lung cancer.  Brother had an MI at age 70.  Social history: Walks and gardens.  Married to [employer] for 31 years.  They have 3 kids.  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  Studies Reviewed: Aaron Aas        Labs from PCP [facility])  (  09/10/2022): TC 200, TG 62, HDL 52, LDL 01/23/1935 (12/04/2022): Hgb 13.5, Cr 0.98, K+ 4.1.  Risk Assessment/Calculations:        Physical Exam:   VS:  BP 126/70   Pulse 73   Ht 5\' 8"  (1.727 m)   Wt 159 lb (72.1 kg)   SpO2 100%   BMI 24.18 kg/m    Wt Readings from Last 3 Encounters:  06/05/23 159 lb (72.1 kg)  12/10/22 170 lb (77.1 kg)  12/04/22 159 lb (72.1 kg)    GEN: Well nourished, well groomed in no acute distress; Relatively healthy appearing NECK: No JVD;  No carotid bruits CARDIAC: RRR, cannot exclude spit S2 vs. S4 Gallop; no murmurs or rubs,  RESPIRATORY:  Clear to auscultation without rales, wheezing or rhonchi ; nonlabored, good air movement. ABDOMEN: Soft, non-tender, non-distended EXTREMITIES:  No edema; No deformity ;  decreased right arm and hand strength    ASSESSMENT AND PLAN: .    Problem List Items Addressed This Visit       Cardiology Problems   RBBB (right bundle branch block) (Chronic)   Noted on EKG, generally benign. Further evaluation if other tests indicate abnormalities. With abnormal heart sounds - consider Echo following initial evaluation with CorCTA & Zio.      Relevant Medications   nitroGLYCERIN (NITROSTAT) 0.4 MG SL tablet     Other   Palpitations   Irregular heartbeats with increased frequency. Differential includes benign premature beats versus arrhythmias. Cardiac monitor needed to correlate symptoms with arrhythmias. - Order a two-week cardiac monitor to evaluate palpitations.      Relevant Orders   LONG TERM MONITOR (3-14 DAYS)   Precordial pain - Primary   Intermittent chest discomfort with atypical presentation. Differential includes cardiac versus gastrointestinal causes. Coronary CT angiography preferred for definitive assessment. - Order coronary CT angiography to evaluate for coronary artery disease. - Prescribe nitroglycerin as needed for chest pain.      Relevant Orders   LONG TERM MONITOR (3-14 DAYS)   CT CORONARY MORPH W/CTA COR W/SCORE W/CA W/CM &/OR WO/CM   Other Visit Diagnoses       Pre-procedure lab exam       Relevant Orders   Basic Metabolic Panel (BMET) (Completed)   CBC (Completed)          Follow-Up: Return in about 6 weeks (around 07/17/2023) for Dr Addie Holstein, Routine Follow-up after testing ~ 1-2 months, [employer]. Plans discussed to review diagnostic test results and determine further management. - Schedule follow-up appointment post coronary  CT angiography and cardiac monitoring.    Signed, Arleen Lacer, MD, MS Randene Bustard, M.D., M.S. Interventional Chartered certified accountant  Pager # [phone]

## 2023-06-05 NOTE — Progress Notes (Unsigned)
 Enrolled patient for a 14 day Zio XT  monitor to be mailed to patients home

## 2023-06-06 ENCOUNTER — Ambulatory Visit: Payer: Self-pay | Admitting: Cardiology

## 2023-06-06 ENCOUNTER — Encounter: Payer: Self-pay | Admitting: Cardiology

## 2023-06-06 DIAGNOSIS — R072 Precordial pain: Secondary | ICD-10-CM | POA: Insufficient documentation

## 2023-06-06 DIAGNOSIS — I4510 Unspecified right bundle-branch block: Secondary | ICD-10-CM | POA: Insufficient documentation

## 2023-06-06 DIAGNOSIS — R002 Palpitations: Secondary | ICD-10-CM | POA: Insufficient documentation

## 2023-06-06 LAB — CBC
Hematocrit: 46 % (ref 37.5–51.0)
Hemoglobin: 14.9 g/dL (ref 13.0–17.7)
MCH: 29.5 pg (ref 26.6–33.0)
MCHC: 32.4 g/dL (ref 31.5–35.7)
MCV: 91 fL (ref 79–97)
Platelets: 246 x10E3/uL (ref 150–450)
RBC: 5.05 x10E6/uL (ref 4.14–5.80)
RDW: 13.3 % (ref 11.6–15.4)
WBC: 5 x10E3/uL (ref 3.4–10.8)

## 2023-06-06 LAB — BASIC METABOLIC PANEL WITH GFR
BUN/Creatinine Ratio: 31 — ABNORMAL HIGH (ref 10–24)
BUN: 29 mg/dL — ABNORMAL HIGH (ref 8–27)
CO2: 26 mmol/L (ref 20–29)
Calcium: 9.3 mg/dL (ref 8.6–10.2)
Chloride: 101 mmol/L (ref 96–106)
Creatinine, Ser: 0.94 mg/dL (ref 0.76–1.27)
Glucose: 86 mg/dL (ref 70–99)
Potassium: 4.7 mmol/L (ref 3.5–5.2)
Sodium: 143 mmol/L (ref 134–144)
eGFR: 83 mL/min/{1.73_m2} (ref >59)

## 2023-06-06 NOTE — Assessment & Plan Note (Signed)
 Irregular heartbeats with increased frequency. Differential includes benign premature beats versus arrhythmias. Cardiac monitor needed to correlate symptoms with arrhythmias. - Order a two-week cardiac monitor to evaluate palpitations.

## 2023-06-06 NOTE — Assessment & Plan Note (Signed)
 Noted on EKG, generally benign. Further evaluation if other tests indicate abnormalities. With abnormal heart sounds - consider Echo following initial evaluation with CorCTA & Zio.

## 2023-06-06 NOTE — Assessment & Plan Note (Signed)
 Intermittent chest discomfort with atypical presentation. Differential includes cardiac versus gastrointestinal causes. Coronary CT angiography preferred for definitive assessment. - Order coronary CT angiography to evaluate for coronary artery disease. - Prescribe nitroglycerin as needed for chest pain.

## 2023-06-19 DIAGNOSIS — K219 Gastro-esophageal reflux disease without esophagitis: Secondary | ICD-10-CM | POA: Diagnosis not present

## 2023-06-19 DIAGNOSIS — R1310 Dysphagia, unspecified: Secondary | ICD-10-CM | POA: Diagnosis not present

## 2023-06-27 ENCOUNTER — Telehealth (HOSPITAL_COMMUNITY): Payer: Self-pay | Admitting: *Deleted

## 2023-06-27 NOTE — Telephone Encounter (Signed)
 Reaching out to patient to offer assistance regarding upcoming cardiac imaging study; pt verbalizes understanding of appt date/time, parking situation and where to check in, pre-test NPO status and medications ordered, and verified current allergies; name and call back number provided for further questions should they arise  Larey Brick RN Navigator Cardiac Imaging [facility] [phone] office [phone] cell

## 2023-06-27 NOTE — Telephone Encounter (Signed)
 Attempted to call patient regarding upcoming cardiac CT appointment. Left message on voicemail with name and callback number  Larey Brick RN Navigator Cardiac Imaging [facility] [phone] Office [phone] Cell

## 2023-06-28 ENCOUNTER — Ambulatory Visit (HOSPITAL_COMMUNITY)
Admission: RE | Admit: 2023-06-28 | Discharge: 2023-06-28 | Disposition: A | Discharge status: Home / Self Care | Source: Ambulatory Visit | Attending: Internal Medicine | Admitting: Internal Medicine

## 2023-06-28 DIAGNOSIS — I2510 Atherosclerotic heart disease of native coronary artery without angina pectoris: Secondary | ICD-10-CM | POA: Diagnosis not present

## 2023-06-28 DIAGNOSIS — I2584 Coronary atherosclerosis due to calcified coronary lesion: Secondary | ICD-10-CM | POA: Insufficient documentation

## 2023-06-28 DIAGNOSIS — R072 Precordial pain: Secondary | ICD-10-CM | POA: Insufficient documentation

## 2023-06-28 DIAGNOSIS — I517 Cardiomegaly: Secondary | ICD-10-CM | POA: Diagnosis not present

## 2023-06-28 DIAGNOSIS — Q2112 Patent foramen ovale: Secondary | ICD-10-CM | POA: Diagnosis not present

## 2023-06-28 MED ORDER — NITROGLYCERIN 0.4 MG SL SUBL
0.8000 mg | SUBLINGUAL_TABLET | Freq: Once | SUBLINGUAL | Status: AC
  Administered 2023-06-28: 0.8 mg via SUBLINGUAL

## 2023-06-28 MED ORDER — IOHEXOL 350 MG/ML SOLN
100.0000 mL | Freq: Once | INTRAVENOUS | Status: AC | PRN
  Administered 2023-06-28: 100 mL via INTRAVENOUS

## 2023-06-28 NOTE — Progress Notes (Signed)
 Patient tolerated CT well. Vital signs stable encourage to drink water throughout day.Reasons explained and verbalized understanding. Ambulated steady gait.

## 2023-06-30 ENCOUNTER — Other Ambulatory Visit: Payer: Self-pay | Admitting: Cardiology

## 2023-06-30 ENCOUNTER — Ambulatory Visit (HOSPITAL_COMMUNITY)
Admission: RE | Admit: 2023-06-30 | Discharge: 2023-06-30 | Disposition: A | Discharge status: Home / Self Care | Source: Ambulatory Visit | Attending: Cardiology | Admitting: Cardiology

## 2023-06-30 DIAGNOSIS — I2510 Atherosclerotic heart disease of native coronary artery without angina pectoris: Secondary | ICD-10-CM

## 2023-06-30 DIAGNOSIS — R072 Precordial pain: Secondary | ICD-10-CM | POA: Diagnosis not present

## 2023-06-30 DIAGNOSIS — R931 Abnormal findings on diagnostic imaging of heart and coronary circulation: Secondary | ICD-10-CM | POA: Diagnosis not present

## 2023-07-02 DIAGNOSIS — R002 Palpitations: Secondary | ICD-10-CM | POA: Diagnosis not present

## 2023-07-02 DIAGNOSIS — R072 Precordial pain: Secondary | ICD-10-CM | POA: Diagnosis not present

## 2023-07-02 NOTE — Telephone Encounter (Signed)
-----   Message from Randene Bustard sent at 07/01/2023  1:33 PM EDT ----- Addendum note - cannot exclude small Patent Foramen Ovale  -- we can discuss in f/u.  Anticipate checking Echo with [facility].  Randene Bustard, MD

## 2023-07-03 ENCOUNTER — Ambulatory Visit: Payer: Self-pay | Admitting: Cardiology

## 2023-08-06 ENCOUNTER — Ambulatory Visit: Attending: Cardiology | Admitting: Cardiology

## 2023-08-06 ENCOUNTER — Encounter: Payer: Self-pay | Admitting: Cardiology

## 2023-08-06 VITALS — BP 144/55 | HR 70 | Ht 68.0 in | Wt 165.0 lb

## 2023-08-06 DIAGNOSIS — I2510 Atherosclerotic heart disease of native coronary artery without angina pectoris: Secondary | ICD-10-CM

## 2023-08-06 DIAGNOSIS — R072 Precordial pain: Secondary | ICD-10-CM

## 2023-08-06 DIAGNOSIS — I480 Paroxysmal atrial fibrillation: Secondary | ICD-10-CM

## 2023-08-06 DIAGNOSIS — I517 Cardiomegaly: Secondary | ICD-10-CM

## 2023-08-06 DIAGNOSIS — I4510 Unspecified right bundle-branch block: Secondary | ICD-10-CM | POA: Diagnosis not present

## 2023-08-06 DIAGNOSIS — E785 Hyperlipidemia, unspecified: Secondary | ICD-10-CM

## 2023-08-06 DIAGNOSIS — I77810 Thoracic aortic ectasia: Secondary | ICD-10-CM | POA: Diagnosis not present

## 2023-08-06 NOTE — Patient Instructions (Signed)
 Medication Instructions:  No changes  *If you need a refill on your cardiac medications before your next appointment, please call your pharmacy*   Lab Work: Not needed     Testing/Procedures:  Schedule in Nov or Dec 2025 Your physician has requested that you have an echocardiogram. Echocardiography is a painless test that uses sound waves to create images of your heart. It provides your doctor with information about the size and shape of your heart and how well your heart's chambers and valves are working. This procedure takes approximately one hour. There are no restrictions for this procedure. Please do NOT wear cologne, perfume, aftershave, or lotions (deodorant is allowed). Please arrive 15 minutes prior to your appointment time.  Please note: We ask at that you not bring children with you during ultrasound (echo/ vascular) testing. Due to room size and safety concerns, children are not allowed in the ultrasound rooms during exams. Our front office staff cannot provide observation of children in our lobby area while testing is being conducted. An adult accompanying a patient to their appointment will only be allowed in the ultrasound room at the discretion of the ultrasound technician under special circumstances. We apologize for any inconvenience.   Follow-Up: At [facility], you and your health needs are our priority.  As part of our continuing mission to provide you with exceptional heart care, we have created designated Provider Care Teams.  These Care Teams include your primary Cardiologist (physician) and Advanced Practice Providers (APPs -  Physician Assistants and Nurse Practitioners) who all work together to provide you with the care you need, when you need it.     Your next appointment:   6 month(s)  Month after echo is completed  The format for your next appointment:   In Person  Provider:   Alm Clay, MD

## 2023-08-06 NOTE — Progress Notes (Signed)
 Cardiology Office Note:  .   Date:  08/10/2023  ID:  Kyle Williamson, DOB 10/21/1945, MRN [id number] PCP: Kyle Elsie JONELLE, MD  [facility] Providers Cardiologist:  Kyle Clay, MD     Chief Complaint  Patient presents with   Follow-up    Discussed test results.   Atrial Fibrillation    A-fib noted on monitor.   Coronary Artery Disease    Nonobstructive CAD by Coronary CTA.    Patient Profile: .     Kyle Williamson is a 78 y.o. male former smoker with a PMH notable for HLD, and OSA who presents here for follow-up evaluation of epigastric/chest pain and abnormal EKG.  Originally referred at the request of Kyle Lorelle Pillar, DO for Kyle Elsie JONELLE, MD.     Kyle Williamson was seen for initial consult on 06/06/2023 for evaluation of palpitations and intermittent precordial pain.  He was evaluated with a 14-day Zio patch monitor and Coronary CTA.  Returns here today to discuss results.  Subjective  Discussed the use of AI scribe software for clinical note transcription with the patient, who gave verbal consent to proceed.  History of Present Illness  Kyle Williamson is a 78 year old male with coronary artery disease who presents for evaluation of coronary artery disease and palpitations.  He underwent a CT angiogram on June 6th, which revealed a coronary calcium score of 3250, indicating significant plaque buildup. The total plaque volume was 2296 mm, with moderate plaque in the LAD and left circumflex arteries. The CT FFR showed no significant pressure drop, with the lowest FFR being 0.87 in the LAD and 0.83 in the right coronary artery.  The CT scan also noted a slightly enlarged aorta at 41.6 mm and a slightly large right ventricle.  He has experienced palpitations, with one episode since the last visit. He attributes some of his symptoms to significant stress, as his partner recently passed away after being in a memory care facility. 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 The heart monitor showed sinus rhythm with a heart rate range of 68 to 140 bpm, with rare premature beats and two short ventricular rhythms. There was a suggestion of possible atrial fibrillation, lasting less than a minute, and 26 short bursts of atrial tachycardia, with the longest lasting 13.5 seconds. He did not notice these episodes.  He otherwise has not really had any more chest pain or pressure with rest or exertion.  Nothing like he had had prior to the visit.  He attributes this to having started treatment for  He is currently on aspirin and nitroglycerin , which he carries but has not needed to use recently. He is not on any medication for blood pressure. No recent episodes of chest pain, shortness of breath, or prolonged palpitations. He reports one episode of palpitations since the last visit.     Objective   Only potential cardiac medications he is on is aspirin 81 mg daily.  Studies Reviewed: SABRA        LABS No results found for: CHOL, HDL, LDLCALC, LDLDIRECT, TRIG, CHOLHDL  09/10/2022: TC 200, TG 60, HDL 50, LDL 137. 12/04/2022: Hgb 13.5; 06/05/2023: Cr 0.94  DIAGNOSTIC 14-day Zio PatchHeart Monitor: Sinus rhythm, heart rate 45-114 bpm with average 68 bpm; rare (<1%) premature beats-PACs and PVCs.;  2 episodes of Ventricular Tachycardia-fastest 19 beats (6.2 seconds) with max rate 214 bpm; 26 atrial runs with the fastest being 6 beats at a max  rate 182 bpm (2.8 seconds) or 4 beats for 1.5 seconds an average of 174 bpm, longest was 27 beats 13.5 seconds with an average rate 121 bpm; ~ possible Atrial Fibrillation <1% overall burden (rate range 88 to 149 bpm), longest A-fib episode 1:03 min with average rate 118 bpm.  RADIOLOGY Coronary CT Angiogram (06/28/2023):SABRA Coronary calcium score is 3249, 93rd percentile for age and sex matched control.  Total plaque volume (TPV) is 2296 mm3, categorized as extensive, 96th percentile for age- and sex-matched  controls.  Normal coronary origins with right dominance.  CAD-RADS 3.  Moderate coronary artery disease: Prox LAD mild (25-49%) calcified plaque, moderate(50-69%) soft plaque in mid LAD, and moderate mixed plaque in distal LAD with tandem lesions within segments.  Mild calcified plaque in proximal LCx and mild mixed plaque in the proximal, mid, and distal RCA. (FFR CT: LM 1.0.  pLAD 0.99, m LAD 0.92,d LAD 0.87; p LCx 0.99, dLCx 0.92, d OM2 0.90; pRCA 0.99, mRCA 0.94, dRCA 0.83 => NO Significant Stenosis).  Mildly dilated RA and RV.  Consider evaluation of LV ventricular noncompaction-echo or cardiac MRI.  Aortic atherosclerosis with mild aortic root dilation 41.5 mm at sinus of Valsalva.  (Recommend repeat imaging in roughly 1 year to reassess). Narrative & Impression  1. Left Main: FFR = 1.0   2. LAD: Proximal FFR = 0.99, Mid FFR = 0.92, Distal FFR = 0.87 3. LCX: Proximal FFR = 0.99, Distal FFR = 0.92 4. OM2: Proximal FFR = 0.97, Distal FFR = 0.90 5. RCA: Proximal FFR = 0.99, Mid FFR =0.94, Distal FFR = 0.83   IMPRESSION: 1.  CT FFR analysis showed no significant stenosis.    Risk Assessment/Calculations:    CHA2DS2-VASc Score \ This patients CHA2DS2-VASc Score and unadjusted Ischemic Stroke Rate (% per year) is equal to 3.2 % stroke rate/year from a score of 3  Above score calculated as 1 point each if present [CHF, HTN, DM, Vascular=MI/PAD/Aortic Plaque, Age if 43-74, or Male] Above score calculated as 2 points each if present [Age > 75, or Stroke/TIA/TE]      Physical Exam:   VS:  BP (!) 144/55   Pulse 70   Ht 5' 8 (1.727 m)   Wt 165 lb (74.8 kg)   SpO2 97%   BMI 25.09 kg/m    Wt Readings from Last 3 Encounters:  08/06/23 165 lb (74.8 kg)  06/05/23 159 lb (72.1 kg)  12/10/22 170 lb (77.1 kg)    GEN: Well nourished, well well-groomed in no acute distress; healthy-appearing.   NECK: No JVD; No carotid bruits CARDIAC: Normal S1, S2; RRR, no murmurs, rubs,  gallops RESPIRATORY:  Clear to auscultation without rales, wheezing or rhonchi ; nonlabored, good air movement. ABDOMEN: Soft, non-tender, non-distended EXTREMITIES:  No edema; No deformity     ASSESSMENT AND PLAN: .    Problem List Items Addressed This Visit       Cardiology Problems   Coronary artery disease involving native coronary artery of native heart without angina pectoris - Primary (Chronic)   Coronary artery disease with moderate plaque noted on Coronary CTA. CT angiogram showed significant calcified plaque with moderate stenosis in the LAD. CTFFR indicated no significant pressure drop, suggesting no immediate intervention needed. Symptoms improved with heartburn treatment, indicating non-cardiac origin. - Continue current management as symptoms have improved -GDM management for CRF modification: Continue aspirin 81 mg daily With poorly controlled lipids, need recent levels in order to determine initiation of treatment.  Anticipate  starting statin likely rosuvastatin 20 mg Borderline blood pressure.  Slightly improved on recheck, still higher than it should be.:  Reassess in follow-up and consider initiation of carvedilol in light of PVCs on monitor.      Relevant Orders   ECHOCARDIOGRAM COMPLETE   Dilation of thoracic aorta (HCC) (Chronic)   Thoracic aortic dilation noted on Coronary CTA however this was at the level of sinus of Valsalva and may be an overestimation.  The recommendation was to reassess with imaging.  We can consider CT angiogram of the aorta next year, but also with PVCs and concern for RV dilation we are checking a 2D echo which can also assess aortic diameter.. - Recheck aorta size with echocardiogram in November or December. - Consider CT scan in the following year if echocardiogram findings are concerning.      Relevant Orders   ECHOCARDIOGRAM COMPLETE   Hyperlipidemia with target low density lipoprotein (LDL) cholesterol less than 70 mg/dL (Chronic)    Given the evidence of CAD on Coronary CTA, would target LDL least less than 70 if not less than 55.  Needs repeat labs but most recent labs did not show adequately controlled lipids. -Likely reassess labs and follow-up and initiate statin with rosuvastatin 20 mg daily.      Paroxysmal atrial fibrillation (HCC) (Chronic)   Short little bursts of A-fib lasting maybe 1 minute on monitor.  It is possible these resistant long runs of atrial tachycardia and not true A-fib. The monitor monitor showed less than 1% atrial fibrillation with brief episodes, which is less concerning for stroke risk. - Monitor for prolonged episodes of irregular heartbeat.-Low threshold to initiate beta-blocker - Use aspirin for stroke prevention if episodes become more frequent or prolonged. - Consider further monitoring if symptoms worsen.      RBBB (right bundle branch block) (Chronic)   Relatively benign finding overall. Plan is to check 2D echo based on PVCs and PAF on monitor.      Relevant Orders   ECHOCARDIOGRAM COMPLETE   Right ventricular enlargement (Chronic)     Other   Precordial pain   Intermittent episodes of chest discomfort that were relatively typical.  Probably more GI in nature since he found some relief with treatment for heartburn.  Coronary CTA did not suggest FFR significant lesions.  He does however have CAD which will need to be treated for as far as risk factor modification.      Relevant Orders   ECHOCARDIOGRAM COMPLETE          Follow-Up: Return in about 6 months (around 02/06/2024) for [employer], Routine follow up with me.  Total time spent: 26 min spent with patient + 17 min spent charting = 43 min I spent 43 minutes in the care of Kyle Williamson today including reviewing outside labs from PCP via [facility] (1 minute), reviewing studies (reviewing monitor results and Coronary CTA results with CT FFR-6 minutes), face to face time discussing treatment options  (26 minutes), reviewing records from previous notes (2 minutes), 8 minutes dictating, and documenting in the encounter.     Signed, Kyle MICAEL Clay, MD, MS Kyle Williamson, M.D., M.S. Interventional Chartered certified accountant  Pager # [phone]

## 2023-08-08 DIAGNOSIS — K5900 Constipation, unspecified: Secondary | ICD-10-CM | POA: Diagnosis not present

## 2023-08-08 DIAGNOSIS — K649 Unspecified hemorrhoids: Secondary | ICD-10-CM | POA: Diagnosis not present

## 2023-08-08 DIAGNOSIS — R1310 Dysphagia, unspecified: Secondary | ICD-10-CM | POA: Diagnosis not present

## 2023-08-08 DIAGNOSIS — K625 Hemorrhage of anus and rectum: Secondary | ICD-10-CM | POA: Diagnosis not present

## 2023-08-10 ENCOUNTER — Encounter: Payer: Self-pay | Admitting: Cardiology

## 2023-08-10 DIAGNOSIS — I480 Paroxysmal atrial fibrillation: Secondary | ICD-10-CM | POA: Insufficient documentation

## 2023-08-10 DIAGNOSIS — E785 Hyperlipidemia, unspecified: Secondary | ICD-10-CM | POA: Insufficient documentation

## 2023-08-10 DIAGNOSIS — I77810 Thoracic aortic ectasia: Secondary | ICD-10-CM | POA: Insufficient documentation

## 2023-08-10 DIAGNOSIS — I2510 Atherosclerotic heart disease of native coronary artery without angina pectoris: Secondary | ICD-10-CM | POA: Insufficient documentation

## 2023-08-10 DIAGNOSIS — I517 Cardiomegaly: Secondary | ICD-10-CM | POA: Insufficient documentation

## 2023-08-10 NOTE — Assessment & Plan Note (Signed)
 Given the evidence of CAD on Coronary CTA, would target LDL least less than 70 if not less than 55.  Needs repeat labs but most recent labs did not show adequately controlled lipids. -Likely reassess labs and follow-up and initiate statin with rosuvastatin 20 mg daily.

## 2023-08-10 NOTE — Assessment & Plan Note (Signed)
 Short little bursts of A-fib lasting maybe 1 minute on monitor.  It is possible these resistant long runs of atrial tachycardia and not true A-fib. The monitor monitor showed less than 1% atrial fibrillation with brief episodes, which is less concerning for stroke risk. - Monitor for prolonged episodes of irregular heartbeat.-Low threshold to initiate beta-blocker - Use aspirin for stroke prevention if episodes become more frequent or prolonged. - Consider further monitoring if symptoms worsen.

## 2023-08-10 NOTE — Assessment & Plan Note (Addendum)
 Thoracic aortic dilation noted on Coronary CTA however this was at the level of sinus of Valsalva and may be an overestimation.  The recommendation was to reassess with imaging.  We can consider CT angiogram of the aorta next year, but also with PVCs and concern for RV dilation we are checking a 2D echo which can also assess aortic diameter.. - Recheck aorta size with echocardiogram in November or December. - Consider CT scan in the following year if echocardiogram findings are concerning.

## 2023-08-10 NOTE — Assessment & Plan Note (Addendum)
 Relatively benign finding overall. Plan is to check 2D echo based on PVCs and PAF on monitor.

## 2023-08-10 NOTE — Assessment & Plan Note (Signed)
 Intermittent episodes of chest discomfort that were relatively typical.  Probably more GI in nature since he found some relief with treatment for heartburn.  Coronary CTA did not suggest FFR significant lesions.  He does however have CAD which will need to be treated for as far as risk factor modification.

## 2023-08-10 NOTE — Assessment & Plan Note (Signed)
 Coronary artery disease with moderate plaque noted on Coronary CTA. CT angiogram showed significant calcified plaque with moderate stenosis in the LAD. CTFFR indicated no significant pressure drop, suggesting no immediate intervention needed. Symptoms improved with heartburn treatment, indicating non-cardiac origin. - Continue current management as symptoms have improved -GDM management for CRF modification: Continue aspirin 81 mg daily With poorly controlled lipids, need recent levels in order to determine initiation of treatment.  Anticipate starting statin likely rosuvastatin 20 mg Borderline blood pressure.  Slightly improved on recheck, still higher than it should be.:  Reassess in follow-up and consider initiation of carvedilol in light of PVCs on monitor.

## 2023-08-18 DIAGNOSIS — R002 Palpitations: Secondary | ICD-10-CM | POA: Diagnosis not present

## 2023-08-18 DIAGNOSIS — R072 Precordial pain: Secondary | ICD-10-CM

## 2023-09-02 DIAGNOSIS — K2930 Chronic superficial gastritis without bleeding: Secondary | ICD-10-CM | POA: Diagnosis not present

## 2023-09-02 DIAGNOSIS — R1310 Dysphagia, unspecified: Secondary | ICD-10-CM | POA: Diagnosis not present

## 2023-09-02 DIAGNOSIS — K2970 Gastritis, unspecified, without bleeding: Secondary | ICD-10-CM | POA: Diagnosis not present

## 2023-09-04 DIAGNOSIS — K2930 Chronic superficial gastritis without bleeding: Secondary | ICD-10-CM | POA: Diagnosis not present

## 2023-09-13 DIAGNOSIS — Z125 Encounter for screening for malignant neoplasm of prostate: Secondary | ICD-10-CM | POA: Diagnosis not present

## 2023-09-13 DIAGNOSIS — F411 Generalized anxiety disorder: Secondary | ICD-10-CM | POA: Diagnosis not present

## 2023-09-13 DIAGNOSIS — E782 Mixed hyperlipidemia: Secondary | ICD-10-CM | POA: Diagnosis not present

## 2023-09-13 DIAGNOSIS — E559 Vitamin D deficiency, unspecified: Secondary | ICD-10-CM | POA: Diagnosis not present

## 2023-09-13 DIAGNOSIS — Z0000 Encounter for general adult medical examination without abnormal findings: Secondary | ICD-10-CM | POA: Diagnosis not present

## 2023-09-16 DIAGNOSIS — L821 Other seborrheic keratosis: Secondary | ICD-10-CM | POA: Diagnosis not present

## 2023-09-16 DIAGNOSIS — L570 Actinic keratosis: Secondary | ICD-10-CM | POA: Diagnosis not present

## 2023-09-16 DIAGNOSIS — Z85828 Personal history of other malignant neoplasm of skin: Secondary | ICD-10-CM | POA: Diagnosis not present

## 2023-09-16 DIAGNOSIS — D692 Other nonthrombocytopenic purpura: Secondary | ICD-10-CM | POA: Diagnosis not present

## 2023-09-25 DIAGNOSIS — I480 Paroxysmal atrial fibrillation: Secondary | ICD-10-CM | POA: Diagnosis not present

## 2023-09-25 DIAGNOSIS — I21A1 Myocardial infarction type 2: Secondary | ICD-10-CM | POA: Diagnosis not present

## 2023-09-25 DIAGNOSIS — R079 Chest pain, unspecified: Secondary | ICD-10-CM | POA: Diagnosis not present

## 2023-09-25 DIAGNOSIS — I499 Cardiac arrhythmia, unspecified: Secondary | ICD-10-CM | POA: Diagnosis not present

## 2023-09-25 DIAGNOSIS — Z79899 Other long term (current) drug therapy: Secondary | ICD-10-CM | POA: Diagnosis not present

## 2023-09-25 DIAGNOSIS — R9431 Abnormal electrocardiogram [ECG] [EKG]: Secondary | ICD-10-CM | POA: Diagnosis not present

## 2023-09-25 DIAGNOSIS — R7989 Other specified abnormal findings of blood chemistry: Secondary | ICD-10-CM | POA: Diagnosis not present

## 2023-09-25 DIAGNOSIS — I4510 Unspecified right bundle-branch block: Secondary | ICD-10-CM | POA: Diagnosis not present

## 2023-09-25 DIAGNOSIS — I4891 Unspecified atrial fibrillation: Secondary | ICD-10-CM | POA: Diagnosis not present

## 2023-09-25 DIAGNOSIS — Z7982 Long term (current) use of aspirin: Secondary | ICD-10-CM | POA: Diagnosis not present

## 2023-09-26 DIAGNOSIS — I351 Nonrheumatic aortic (valve) insufficiency: Secondary | ICD-10-CM | POA: Diagnosis not present

## 2023-09-26 DIAGNOSIS — I4510 Unspecified right bundle-branch block: Secondary | ICD-10-CM | POA: Diagnosis not present

## 2023-09-26 DIAGNOSIS — R7989 Other specified abnormal findings of blood chemistry: Secondary | ICD-10-CM | POA: Diagnosis not present

## 2023-09-26 DIAGNOSIS — I4891 Unspecified atrial fibrillation: Secondary | ICD-10-CM | POA: Diagnosis not present

## 2023-09-26 DIAGNOSIS — I517 Cardiomegaly: Secondary | ICD-10-CM | POA: Diagnosis not present

## 2023-09-26 NOTE — Discharge Summary (Signed)
 ------------------------------------------------------------------------------- Attestation signed by Erica Heddy Lia, MD at 09/29/2023  3:33 PM I have discussed the plan of care with APP and agree with documentation as below I agree with discharge today -------------------------------------------------------------------------------    Hospital Medicine Discharge Summary   Demographics: Kyle Williamson  78 y.o. 1945/11/08 MRN: [id number]    Extended Emergency Contact Information Primary Emergency Contact: [facility] Mobile Phone: [phone] Relation: Daughter  Full Code  Admit Date: 09/25/2023                            Attending Physician: No att. providers found Discharge Date: 09/26/2023  Primary Care Provider: Elsie JONELLE Lesches, MD   [phone]  Consults during this admission: Consult Orders     None      Active & Resolved Diagnosis: Principal Problem:   Troponin level elevated Resolved Problems:   Transient atrial fibrillation    (CMD)  Disposition: Patient discharged to Home in stable condition.  Discharge follow-up recommendations : See [facility]: Kailan Laws. Malter is a 78 year old male who presented with chest pain and an irregular heart rate, with a history of atrial fibrillation relevant to his acute presentation. He was admitted for evaluation of an elevated troponin level, which was identified as the principal problem during his hospitalization. On arrival, he described an episode of ache/pressure-like chest pain radiating to his neck and jaw, associated with lightheadedness and diaphoresis, without shortness of breath, nausea, or vomiting. He self-administered aspirin and nitroglycerin  prior to EMS arrival, and was found to be in atrial fibrillation with rapid ventricular response, which converted to sinus rhythm in the emergency department.  Diagnostic evaluation included serial troponin measurements, which were mildly elevated  (11-20 ng/L), and an electrocardiogram showing sinus rhythm without ST elevations. A chest X-ray was unremarkable, and laboratory studies revealed stable hemoglobin, normal renal and liver function, and no leukocytosis. An echocardiogram was ordered for further assessment.  Therapeutic management included continuation of aspirin 81 mg daily and nitroglycerin  as needed for chest pain. His CHA2DS2-VASc score was calculated as 2, based solely on age, and he was monitored for further arrhythmia or ischemic symptoms. He remained hemodynamically stable and in sinus rhythm throughout observation.  During the admission, his atrial fibrillation resolved without further intervention, and no additional acute medical or social issues were identified.  Went to the echocardiogram which showed left ventricular hypertrophy EF 55 to 60% with mild aortic regurgitation.  Repeat troponins 20-->24-->22.  Patient denied chest pain at time of discharge.  Hemo-dynamically stable and ready for discharge home.  Patient was advised to follow-up with his established cardiologist to discuss anticoagulation options.  Follow-up with the PCP in the next 7 to 14 days. Assessment & Plan Troponin level elevated -20-->24--> 22 Echocardiogram shows no abnormal wall motion abnormalities Patient remains chest pain-free  #Transient atrial fibrillation - Patient arrived to the ED via EMS transport in sinus rhythm, was in A-fib with RVR during EMS evaluation - Troponins were mildly elevated secondary to elevated heart rate -2D echo no acute findings - CHA2DS2-VASc score 2 - Advised to follow-up with established cardiologist to discuss anticoagulation - Follow-up with PCP in the next 7 to 14 days      Wound / Incision Assessment: Refer to Chart Review and Media Tab for images if available.     Vital Sign Range:  Temp:  [97.9 F (36.6 C)-98.1 F (36.7 C)] 97.9 F (36.6 C) Heart Rate:  [52-107]  65 Resp:  [14-20] 18 BP:  (92-156)/(53-78) 135/62  Physical Exam:  Constitutional: NAD, pleasant and cooperative HEENT: Pupils equal, round. Conjunctiva clear. Mucosa moist.  CV: RRR no M,R,G. Pulses +2 LE. No JVD. No peripheral edema. No cyanosis, clubbing, or varicosities RESP: Clear to auscultation B/L, normal respiratory rate, no use of accessory muscles  GI: +BS, NTND Neuro:no focal deficit. Sensation intact Psych: AAOx3, Full affect, good mood, normal judgement, good insight  Skin: Warm and dry. No ulcerations on exposed arms and legs.      Discharge Medications     Medications To Continue      Sig Disp Refill Start End  ascorbic acid (vitamin C) 500 mg Cap  Take 500 mg by mouth daily.   0     aspirin 81 mg Tab  Take 81 mg by mouth daily.   0     cholecalciferol 1,000 unit (25 mcg) tablet Commonly known as: VITAMIN D3  Take 1,000 Units by mouth daily.   0     cyanocobalamin 1,000 mcg tablet Commonly known as: VITAMIN B12  Take 1,000 mcg by mouth daily.   0     escitalopram  20 mg tablet Commonly known as: LEXAPRO   Take 1 tablet by mouth daily.   0     magnesium oxide 400 mg (241 mg magnesium) Tab  Take 400 mg by mouth daily.   0     melatonin 3 mg tablet  Take 3 mg by mouth nightly as needed for sleep.   0     nitroglycerin  0.4 mg SL tablet Commonly known as: NITROSTAT   Place 0.4 mg under the tongue every 5 (five) minutes as needed for chest pain.   0     turmeric (bulk) 95 % Powd  Take 0.5 g by mouth daily.   0         Discharge Orders     Discharge instructions - Other (specify)     Comments: Follow up with PCP in the next 7-14 days Follow up with Primary cardiologist in the next 14 days   Full Code     Lifting Limits:     Details:    Lifting Limits: No lifting limits   Return to previous diet     Details:    Diet type: Return to previous diet         Lab Results  Component Value Date/Time   HGB 12.5 (L) 09/25/2023 10:15 PM   HCT 37.5 (L) 09/25/2023  10:15 PM   WBC 5.22 09/25/2023 10:15 PM   PLT 196 09/25/2023 10:15 PM   Lab Results  Component Value Date/Time   NA 139 09/25/2023 10:15 PM   K 4.1 09/25/2023 10:15 PM   CREATININE 1.19 09/25/2023 10:15 PM   BUN 28 (H) 09/25/2023 10:15 PM   GLUCOSE 133 (H) 09/25/2023 10:15 PM    Pertinent Imaging: XR Chest 1 View  Final Result by Eulas Carlota Dauer, MD (09/04 1312)  XR CHEST 1 VIEW, 09/25/2023 10:26 PM    INDICATION:chest pain   COMPARISON: None.    FINDINGS:     Supportive devices: None.  Cardiovascular/lungs/pleura: Cardiac silhouette and pulmonary vasculature   are within normal limits. No focal consolidation, no pleural effusion, no   pneumothorax. Calcified aorta noted.  Other: Degenerative changes of the spine/both shoulders and postsurgical   changes of the cervical spine seen.    IMPRESSION:  There is no evidence of acute cardiac or pulmonary abnormality.  Electronically signed by: Jhonny Pali, ACNP 09/26/2023 5:34 PM   Time spent on discharge: >35 minutes

## 2023-09-30 NOTE — Progress Notes (Unsigned)
  Cardiology Office Note:  .   Date:  09/30/2023  ID:  Kyle Williamson, DOB 06/26/1945, MRN [id number] PCP: [name], MD  [facility] Providers Cardiologist:  Alm Clay, MD {  History of Present Illness: .   Kyle Williamson is a 78 y.o. male with history of ***       Social history       ROS: Denies: Chest pain, shortness of breath, orthopnea, peripheral edema, palpitations, decreased exercise intolerance, fatigue, lightheadedness.   Studies Reviewed: .         Risk Assessment/Calculations:   {Does this patient have ATRIAL FIBRILLATION?:[phone]} No BP recorded.  {Refresh Note OR Click here to enter BP  :1}***       Physical Exam:   VS:  There were no vitals taken for this visit.   Wt Readings from Last 3 Encounters:  08/06/23 165 lb (74.8 kg)  06/05/23 159 lb (72.1 kg)  12/10/22 170 lb (77.1 kg)    GEN: Well nourished, well developed in no acute distress NECK: No JVD; No carotid bruits CARDIAC: ***RRR, no murmurs, rubs, gallops RESPIRATORY:  Clear to auscultation without rales, wheezing or rhonchi  ABDOMEN: Soft, non-tender, non-distended EXTREMITIES:  No edema; No deformity   ASSESSMENT AND PLAN: .         {Are you ordering a CV Procedure (e.g. stress test, cath, DCCV, TEE, etc)?   Press F2        :[id number]}  Dispo: ***  Signed, Thom LITTIE Sluder, PA-C

## 2023-10-02 ENCOUNTER — Encounter: Payer: Self-pay | Admitting: Physician Assistant

## 2023-10-02 ENCOUNTER — Ambulatory Visit: Attending: Cardiovascular Disease | Admitting: Cardiology

## 2023-10-02 ENCOUNTER — Ambulatory Visit

## 2023-10-02 VITALS — BP 128/62 | HR 65 | Ht 68.0 in | Wt 160.6 lb

## 2023-10-02 DIAGNOSIS — I4510 Unspecified right bundle-branch block: Secondary | ICD-10-CM | POA: Diagnosis not present

## 2023-10-02 DIAGNOSIS — I480 Paroxysmal atrial fibrillation: Secondary | ICD-10-CM | POA: Diagnosis not present

## 2023-10-02 DIAGNOSIS — I2510 Atherosclerotic heart disease of native coronary artery without angina pectoris: Secondary | ICD-10-CM | POA: Diagnosis not present

## 2023-10-02 DIAGNOSIS — I77810 Thoracic aortic ectasia: Secondary | ICD-10-CM

## 2023-10-02 MED ORDER — METOPROLOL TARTRATE 25 MG PO TABS: 25.0000 mg | ORAL_TABLET | ORAL | 1 refills | Status: DC | PRN

## 2023-10-02 MED ORDER — ROSUVASTATIN CALCIUM 20 MG PO TABS: 20.0000 mg | ORAL_TABLET | Freq: Every day | ORAL | 3 refills | Status: AC

## 2023-10-02 NOTE — Progress Notes (Unsigned)
Enrolled patient for a 14 day ZIo XT monitor to be mailed to patients home  Kyle Williamson to read

## 2023-10-02 NOTE — Patient Instructions (Signed)
 Medication Instructions:  Your physician has recommended you make the following change in your medication:   START Rosuvastatin  20 mg taking 1 daily  START Metoprolol  Tartrate 25 mg taking only as needed for Palpitations / Tachycardia  *If you need a refill on your cardiac medications before your next appointment, please call your pharmacy*  Lab Work: 2 MONTHS, COME BACK TO THE LAB, FASTING, FOR:  LIPID  If you have labs (blood work) drawn today and your tests are completely normal, you will receive your results only by: MyChart Message (if you have MyChart) OR A paper copy in the mail If you have any lab test that is abnormal or we need to change your treatment, we will call you to review the results.  Testing/Procedures: Kyle Williamson- Long Term Monitor Instructions  Your physician has requested you wear a ZIO patch monitor for 14 days.  This is a single patch monitor. [employer] supplies one patch monitor per enrollment. Additional stickers are not available. Please do not apply patch if you will be having a Nuclear Stress Test,  Echocardiogram, Cardiac CT, MRI, or Chest Xray during the period you would be wearing the  monitor. The patch cannot be worn during these tests. You cannot remove and re-apply the  ZIO XT patch monitor.  Your ZIO patch monitor will be mailed 3 day USPS to your address on file. It may take 3-5 days  to receive your monitor after you have been enrolled.  Once you have received your monitor, please review the enclosed instructions. Your monitor  has already been registered assigning a specific monitor serial # to you.  Billing and Patient Assistance Program Information  We have supplied [employer] with any of your insurance information on file for billing purposes. [employer] offers a sliding scale Patient Assistance Program for patients that do not have  insurance, or whose insurance does not completely cover the cost of the ZIO monitor.  You must apply for the Patient  Assistance Program to qualify for this discounted rate.  To apply, please call [employer] at [phone], select option 4, select option 2, ask to apply for  Patient Assistance Program. Meredeth will ask your household income, and how many people  are in your household. They will quote your out-of-pocket cost based on that information.  [employer] will also be able to set up a 71-month, interest-free payment plan if needed.  Applying the monitor   Shave hair from upper left chest.  Hold abrader disc by orange tab. Rub abrader in 40 strokes over the upper left chest as  indicated in your monitor instructions.  Clean area with 4 enclosed alcohol pads. Let dry.  Apply patch as indicated in monitor instructions. Patch will be placed under collarbone on left  side of chest with arrow pointing upward.  Rub patch adhesive wings for 2 minutes. Remove white label marked 1. Remove the white  label marked 2. Rub patch adhesive wings for 2 additional minutes.  While looking in a mirror, press and release button in center of patch. A small green light will  flash 3-4 times. This will be your only indicator that the monitor has been turned on.  Do not shower for the first 24 hours. You may shower after the first 24 hours.  Press the button if you feel a symptom. You will hear a small click. Record Date, Time and  Symptom in the Patient Logbook.  When you are ready to remove the patch, follow instructions on the last  2 pages of Patient  Logbook. Stick patch monitor onto the last page of Patient Logbook.  Place Patient Logbook in the blue and white box. Use locking tab on box and tape box closed  securely. The blue and white box has prepaid postage on it. Please place it in the mailbox as  soon as possible. Your physician should have your test results approximately 7 days after the  monitor has been mailed back to [facility].  Call [facility] Customer Care at [phone] if you have questions  regarding  your ZIO XT patch monitor. Call them immediately if you see an orange light blinking on your  monitor.  If your monitor falls off in less than 4 days, contact our Monitor department at [phone].  If your monitor becomes loose or falls off after 4 days call [employer] at [phone] for  suggestions on securing your monitor   Follow-Up: At [facility], you and your health needs are our priority.  As part of our continuing mission to provide you with exceptional heart care, our providers are all part of one team.  This team includes your primary Cardiologist (physician) and Advanced Practice Providers or APPs (Physician Assistants and Nurse Practitioners) who all work together to provide you with the care you need, when you need it.  Your next appointment:   2 month(s)  Provider:   Alm Clay, MD    We recommend signing up for the patient portal called MyChart.  Sign up information is provided on this After Visit Summary.  MyChart is used to connect with patients for Virtual Visits (Telemedicine).  Patients are able to view lab/test results, encounter notes, upcoming appointments, etc.  Non-urgent messages can be sent to your provider as well.   To learn more about what you can do with MyChart, go to [email or web address].   Other Instructions

## 2023-10-04 ENCOUNTER — Telehealth: Payer: Self-pay | Admitting: Cardiology

## 2023-10-04 ENCOUNTER — Other Ambulatory Visit: Payer: Self-pay

## 2023-10-04 MED ORDER — METOPROLOL TARTRATE 25 MG PO TABS: 25.0000 mg | ORAL_TABLET | ORAL | 1 refills | Status: DC | PRN

## 2023-10-04 NOTE — Telephone Encounter (Signed)
 Pt c/o medication issue:  1. Name of Medication:  metoprolol  tartrate (LOPRESSOR ) 25 MG tablet  2. How are you currently taking this medication (dosage and times per day)?   3. Are you having a reaction (difficulty breathing--STAT)?   4. What is your medication issue?   Prescription is written to take as needed. Pharmacy would like to clarify frequency. Please advise.

## 2023-10-09 MED ORDER — METOPROLOL TARTRATE 25 MG PO TABS: 25.0000 mg | ORAL_TABLET | Freq: Two times a day (BID) | ORAL | Status: AC | PRN

## 2023-10-09 NOTE — Telephone Encounter (Signed)
 Medication instructions updated

## 2023-10-25 DIAGNOSIS — I480 Paroxysmal atrial fibrillation: Secondary | ICD-10-CM | POA: Diagnosis not present

## 2023-10-25 DIAGNOSIS — I2510 Atherosclerotic heart disease of native coronary artery without angina pectoris: Secondary | ICD-10-CM | POA: Diagnosis not present

## 2023-11-25 ENCOUNTER — Other Ambulatory Visit (HOSPITAL_COMMUNITY)

## 2023-11-28 ENCOUNTER — Ambulatory Visit: Admitting: Cardiology

## 2023-11-28 DIAGNOSIS — I77810 Thoracic aortic ectasia: Secondary | ICD-10-CM | POA: Diagnosis not present

## 2023-11-28 DIAGNOSIS — I4510 Unspecified right bundle-branch block: Secondary | ICD-10-CM | POA: Diagnosis not present

## 2023-11-28 DIAGNOSIS — I2510 Atherosclerotic heart disease of native coronary artery without angina pectoris: Secondary | ICD-10-CM | POA: Diagnosis not present

## 2023-11-28 DIAGNOSIS — I480 Paroxysmal atrial fibrillation: Secondary | ICD-10-CM | POA: Diagnosis not present

## 2023-11-29 LAB — LIPID PANEL
Chol/HDL Ratio: 2.2 ratio (ref 0.0–5.0)
Cholesterol, Total: 124 mg/dL (ref 100–199)
HDL: 57 mg/dL (ref >39)
LDL Chol Calc (NIH): 53 mg/dL (ref 0–99)
Triglycerides: 65 mg/dL (ref 0–149)
VLDL Cholesterol Cal: 14 mg/dL (ref 5–40)

## 2023-11-30 ENCOUNTER — Ambulatory Visit: Payer: Self-pay | Admitting: Cardiology

## 2023-12-02 NOTE — Telephone Encounter (Signed)
Lvm of results and clinic number if any questions or concerns

## 2023-12-02 NOTE — Telephone Encounter (Signed)
-----   Message from Thom LITTIE Sluder sent at 11/30/2023  8:59 AM EST ----- He has had a great response to statin therapy with an LDL 53.  Great job, continue lifestyle modifications.  Continue current regiment. ----- Message ----- From: Interface, [facility] Lab Results In Sent: 11/29/2023   1:35 AM EST To: Thom LITTIE Sluder, PA-C

## 2023-12-03 ENCOUNTER — Encounter: Payer: Self-pay | Admitting: *Deleted

## 2023-12-04 ENCOUNTER — Ambulatory Visit: Admitting: Emergency Medicine

## 2023-12-04 ENCOUNTER — Ambulatory Visit: Admitting: Cardiology

## 2023-12-04 DIAGNOSIS — M1712 Unilateral primary osteoarthritis, left knee: Secondary | ICD-10-CM | POA: Diagnosis not present

## 2023-12-04 NOTE — Progress Notes (Deleted)
 Cardiology Office Note:    Date:  12/04/2023  ID:  Kyle Williamson, DOB [birth year], MRN [id number] PCP: [name], MD  [facility] Providers Cardiologist:  Alm Clay, MD { Click to update primary MD,subspecialty MD or APP then REFRESH:1}    {Click to Open Review  :1}   Patient Profile:       Chief Complaint: *** History of Present Illness:  Kyle Williamson is a 78 y.o. male with visit-pertinent history of paroxysmal atrial fibrillation, nonobstructive coronary artery disease, hyperlipidemia, dilation of thoracic aorta, RBBB, right CIA aneurysm in 2019  CAD Referred to cardiology given right bundle branch block.   CCTA 06/2023 CAC score 3250, total plaque volume 2296.  Moderate plaque in the LAD and left circumflex.  Negative by FFR.   PAF Heart monitor 06/2023 showing possible atrial fibrillation lasting less than a minute.  26 short burst of atrial tachycardia longest episode 13.5 seconds.  Asymptomatic.  Anticoagulation deferred Admitted for afib RVR 09/2023 at [facility].  Spontaneously converted to sinus during EMS evaluation.  No clear triggers.  EF normal.   CIA aneurysm Noted on vascular study 05/2017    Social history  Brother had MI at age 64 Walks and gardens.  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  Lives alone and independent, has a museum/gallery conservator.  With history of proximal atrial fibrillation with very short duration less than 1 minute based on heart monitor not currently on anticoagulation.  He does have history of coronary artery disease with considerable calcium  buildup with coronary artery calcium  score 2250 and moderate disease in the LAD and LCx with negative FFR.  He was recently admitted on 09/2023 for 1 day at [facility] for chest pain with radiation to the neck and jaw associate with diaphoresis and shortness of breath.  Troponins were minimally elevated in the 20s.  He was found to be in A-fib with RVR but  converted to sinus rhythm in the ED.  Echocardiogram showed normal LVEF with mild aortic regurgitation.  Given CHA2DS2-VASc of 2 anticoagulation was deferred to the outpatient setting.  He was last seen in clinic on 10/02/2023 by [place], [place].  He was doing well and was cardiac unaware of his arrhythmias.  Patient's preference was not to be on anticoagulation so 2-week heart monitor was ordered showing average heart rate 63 bpm, 29 supraventricular tachycardia events occurred, the run with the fastest interval lasting 10 beats with a max rate of 190 bpm, the longest lasting 18 beats with average rate of 101 bpm.  No episodes of atrial fibrillation.  Discussed the use of AI scribe software for clinical note transcription with the patient, who gave verbal consent to proceed.  History of Present Illness     Review of systems:  Please see the history of present illness. All other systems are reviewed and otherwise negative. ***      Studies Reviewed:        ***  Risk Assessment/Calculations:   {Does this patient have ATRIAL FIBRILLATION?:[id number]} No BP recorded.  {Refresh Note OR Click here to enter BP  :1}***        Physical Exam:   VS:  There were no vitals taken for this visit.   Wt Readings from Last 3 Encounters:  10/02/23 160 lb 9.6 oz (72.8 kg)  08/06/23 165 lb (74.8 kg)  06/05/23 159 lb (72.1 kg)    GEN: Well nourished, well developed in no acute distress  NECK: No JVD; No carotid bruits CARDIAC: ***RRR, no murmurs, rubs, gallops RESPIRATORY:  Clear to auscultation without rales, wheezing or rhonchi  ABDOMEN: Soft, non-tender, non-distended EXTREMITIES:  No edema; No acute deformity ***      Assessment and Plan:    Assessment and Plan Assessment & Plan      {Are you ordering a CV Procedure (e.g. stress test, cath, DCCV, TEE, etc)?   Press F2        :[id number]}  Dispo:  No follow-ups on file.  Signed, Lum LITTIE Louis, NP

## 2024-01-05 DIAGNOSIS — I480 Paroxysmal atrial fibrillation: Secondary | ICD-10-CM

## 2024-01-05 DIAGNOSIS — I4510 Unspecified right bundle-branch block: Secondary | ICD-10-CM

## 2024-01-05 DIAGNOSIS — I2510 Atherosclerotic heart disease of native coronary artery without angina pectoris: Secondary | ICD-10-CM

## 2024-01-05 DIAGNOSIS — I77810 Thoracic aortic ectasia: Secondary | ICD-10-CM

## 2024-01-10 ENCOUNTER — Ambulatory Visit: Attending: Emergency Medicine | Admitting: Emergency Medicine

## 2024-01-10 ENCOUNTER — Encounter: Payer: Self-pay | Admitting: Emergency Medicine

## 2024-01-10 VITALS — BP 138/60 | HR 68 | Ht 68.0 in | Wt 167.0 lb

## 2024-01-10 DIAGNOSIS — I77810 Thoracic aortic ectasia: Secondary | ICD-10-CM

## 2024-01-10 DIAGNOSIS — I4510 Unspecified right bundle-branch block: Secondary | ICD-10-CM

## 2024-01-10 DIAGNOSIS — G4733 Obstructive sleep apnea (adult) (pediatric): Secondary | ICD-10-CM | POA: Diagnosis not present

## 2024-01-10 DIAGNOSIS — I2510 Atherosclerotic heart disease of native coronary artery without angina pectoris: Secondary | ICD-10-CM

## 2024-01-10 DIAGNOSIS — I480 Paroxysmal atrial fibrillation: Secondary | ICD-10-CM | POA: Diagnosis not present

## 2024-01-10 DIAGNOSIS — R072 Precordial pain: Secondary | ICD-10-CM

## 2024-01-10 DIAGNOSIS — I351 Nonrheumatic aortic (valve) insufficiency: Secondary | ICD-10-CM | POA: Diagnosis not present

## 2024-01-10 MED ORDER — METOPROLOL SUCCINATE ER 25 MG PO TB24: 25.0000 mg | ORAL_TABLET | Freq: Every day | ORAL | 2 refills | Status: AC

## 2024-01-10 NOTE — Patient Instructions (Signed)
 Medication Instructions:  STAR TAKING METOPROLOL  SUCCINATE 25 MG DAILY.  Lab Work: NONE TO BE DONE TODAY.  Testing/Procedures: TO BE SCHEDULED TODAY.  Your physician has requested that you have an echocardiogram. Echocardiography is a painless test that uses sound waves to create images of your heart. It provides your doctor with information about the size and shape of your heart and how well your hearts chambers and valves are working. This procedure takes approximately one hour. There are no restrictions for this procedure. Please do NOT wear cologne, perfume, aftershave, or lotions (deodorant is allowed). Please arrive 15 minutes prior to your appointment time.  Please note: We ask at that you not bring children with you during ultrasound (echo/ vascular) testing. Due to room size and safety concerns, children are not allowed in the ultrasound rooms during exams. Our front office staff cannot provide observation of children in our lobby area while testing is being conducted. An adult accompanying a patient to their appointment will only be allowed in the ultrasound room at the discretion of the ultrasound technician under special circumstances. We apologize for any inconvenience.   Follow-Up: At [facility], you and your health needs are our priority.  As part of our continuing mission to provide you with exceptional heart care, our providers are all part of one team.  This team includes your primary Cardiologist (physician) and Advanced Practice Providers or APPs (Physician Assistants and Nurse Practitioners) who all work together to provide you with the care you need, when you need it.  Your next appointment:   6 WEEKS  Provider:   MADISON FOUNTAIN, NP

## 2024-01-10 NOTE — Progress Notes (Signed)
 " Cardiology Office Note:    Date:  01/10/2024  ID:  Kyle Williamson, DOB [birth year], MRN [id number] PCP: [name], MD  [facility] Providers Cardiologist:  Alm Clay, MD       Patient Profile:       Chief Complaint: 17-month follow-up History of Present Illness:  Kyle Williamson is a 78 y.o. male with visit-pertinent history of hyperlipidemia, former smoker, OSA  He was seen for initial consult on 06/06/2023 for evaluation of palpitations and intermittent precordial pain.  He underwent coronary CTA on 06/28/2023 showing coronary calcium  score 3249 (93rd percentile) and total plaque volume of 2296, categorized as extensive which is 96 percentile for his age.  FFR analysis showed no significant stenosis.  Zio 06/05/2023 showed 2 runs of ventricular tachycardia with fastest and longest being 19 beats with average rate of 189 bpm, less than 1% A-fib burden with 1 episode lasting just over 1 minute, and 26 short atrial runs.  He was seen for follow-up by Dr. Clay on 08/06/2023.  Symptoms of chest pain improved with heartburn treatment.  Given monitor showed less than 1% atrial fibrillation burden aspirin was used for stroke prevention.  He was admitted on 09/2023 for 1 day at [facility] for chest pain and atrial fibrillation.  He was found in A-fib RVR by EMS but converted to sinus rhythm on transport to the ED.  Troponins were minimally elevated at 20, 24, 22.  Echocardiogram showed normal LVEF and mild aortic regurgitation.  Patient denied chest pains at discharge.  Given CHADS2 Vascor 2 anticoagulation was deferred to the outpatient setting.  He was last seen in clinic on 09/24/2023 by [place], [place].  He is without any chest pains or decrease in exercise capacity.  He was continued on aspirin 81 mg daily and started on rosuvastatin  20 mg daily.  2-week ZIO was ordered showing predominant rhythm with sinus rhythm with average of 63 bpm, there were 29 brief/nonsustained atrial PAT/SVT  runs, rare PACs and PVCs and no sustained arrhythmias such as atrial fibrillation or atrial flutter.   Discussed the use of AI scribe software for clinical note transcription with the patient, who gave verbal consent to proceed.  History of Present Illness Kyle Williamson is a 78 year old male with coronary artery disease and atrial fibrillation who presents for 38-month follow-up.  Today he continues to have chest discomfort.  He reports that resolved for a couple months but he has experienced intermittent chest pain for the past three weeks, occurring about every other day and lasting 15 to 30 minutes, with one episode up to an hour. The pain sometimes begins in the mid back, radiates to the anterior chest, and can be associated with arm numbness. One severe episode a week ago required nitroglycerin  with relief. The pain occurs at rest or with light activity such as driving or sitting, with no consistent trigger, though it may be worse in cold weather.  He has reflux and uses Tums and a new Prevacid regimen, which have provided partial relief of the chest discomfort.  He has atrial fibrillation but has not had recent episodes.  He denies any palpitations or episodes of tachycardia.   He quit smoking in the mid-1980s after about 12 years of use.  He denies dyspnea, orthopnea, PND, LEE, syncope, presyncope, lightheadedness, dizziness  Review of systems:  Please see the history of present illness. All other systems are reviewed and otherwise negative.      Studies Reviewed:  EKG Interpretation Date/Time:  Friday January 10 2024 10:03:12 EST Ventricular Rate:  68 PR Interval:  180 QRS Duration:  134 QT Interval:  414 QTC Calculation: 440 R Axis:   90  Text Interpretation: Normal sinus rhythm Right bundle branch block Confirmed by Rana Dixon [phone]) on 01/10/2024 5:33:41 PM    ZIO 10/02/2023   14-Day Zio Patch Monitor: Patch Wear Time:  13 days and 23 hours  (2025-09-16T20:35:48-0400 to 2025-09-30T20:35:40-0400)   Predominant underlying rhythm is sinus rhythm with a rate range of 43 to 190 bpm and average of 63 bpm.   There were 29 Brief/Nonsustained Atrial (PAT/SVT Runs: Fastest was 10 beats at a max rate of 190 bpm and longest was 18 beats at an average of 101 bpm.   Rare (<1.0%) Premature Atrial and Ventricular Contractions (PACs and PVCs) with Minimal Atrial Couplets or Triplets.   No Sustained Arrhythmias: Atrial Fibrillation (A-Fib), Atrial Flutter (A-Flutter), Atrial Tachycardia (AT), Supraventricular Tachycardia (SVT), or  Sustained Ventricular Tachycardia (VT)     No episodes of A-fib with 2-week monitor.  This would suggest a relatively low A-fib burden, however it can always recur.  Transthoracic echocardiogram 09/26/2023 ([facility]) The left ventricular size is normal.  Mild left ventricular hypertrophy  LV ejection fraction = 55-60%.  The right ventricle is normal size.  The right ventricular systolic function is normal.  There is mild aortic regurgitation.  The aortic sinus is mildly dilated.  measured at 4.3 cm  IVC size was normal.  There is no pericardial effusion.   ZIO 07/03/2023   14-day Zio patch monitor (May-June 2025)   Predominant rhythm was sinus with a rate range of 45 to 114 bpm and average of 68 bpm.   2 Runs of Ventricular Tachycardia (4+ PVCs): Fastest and longest was 19 beats (6.2 seconds) rate range 167 to 214 bpm and average of 189 bpm.   <1% A-fib burden with the 1 episode lasting just over 1 minute.  Heart rate range was 88 to 149 bpm with an average of 118 bpm.  (Cannot exclude prolonged atrial run)   26 short Atrial Runs: Fastest was 6 beats (2.8 seconds) with a rate range of 79 to 182 bpm and average 146 bpm; Longest was 27 beats (13.5 seconds) with an average rate of 121 bpm.  Coronary CTA 06/28/2023 1. Coronary calcium  score is 3249, which places the patient in the 93rd percentile for age and sex matched  control.   2. Total plaque volume (TPV) is 2296 mm3, categorized as extensive, which is 96th percentile for age- and sex-matched controls.   3. Normal coronary origins with right dominance.   4. CAD-RADS 3.  Moderate coronary artery disease.   5. Mild stenosis (25-49%) due to calcified plaque in th proximal LAD, moderate stenosis (50-69%) due to soft plaque in mid LAD, and moderate stenosis (50-69%) due to mixed plaque in distal LAD with tandem lesions noted within the segments.   6. Mild stenosis (25-49%) due to calcified plaque in proximal LCX.   7. Mild stenosis (25-49%) due to mixed plaque in the proximal, mid, and distal RCA.   8. CT FFR will be performed to further evaluate LAD and RCA. Findings will reported separately.   9. 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 Recommend repeating imaging (echocardiogram, CTA Aorta Protocol, MRA Aorta Protocol) in 12 months.   10. Right atrium and ventricle visually appear dilated, correlate with echocardiography.   11. Left ventricle cavity  grossly normal in size, consider evaluation for left ventricular non-compaction if clinically indicated, recommend either echocardiography or cardiac MRI for further evaluation.   12. Aortic atherosclerosis.  FFR IMPRESSION: 1.  CT FFR analysis showed no significant stenosis.   RECOMMENDATIONS: Guideline-directed medical therapy and aggressive risk factor modification for secondary prevention of coronary artery disease. Risk Assessment/Calculations:    CHA2DS2-VASc Score = 3   This indicates a 3.2% annual risk of stroke. The patient's score is based upon: CHF History: 0 HTN History: 0 Diabetes History: 0 Stroke History: 0 Vascular Disease History: 1 Age Score: 2 Gender Score: 0             Physical Exam:   VS:  BP 138/60 (BP Location: Left Arm, Patient Position: Sitting, Cuff Size: Normal)   Pulse 68   Ht 5' 8 (1.727 m)   Wt 167 lb  (75.8 kg)   BMI 25.39 kg/m    Wt Readings from Last 3 Encounters:  01/10/24 167 lb (75.8 kg)  10/02/23 160 lb 9.6 oz (72.8 kg)  08/06/23 165 lb (74.8 kg)    GEN: Well nourished, well developed in no acute distress NECK: No JVD; No carotid bruits CARDIAC: RRR, no murmurs, rubs, gallops RESPIRATORY:  Clear to auscultation without rales, wheezing or rhonchi  ABDOMEN: Soft, non-tender, non-distended EXTREMITIES:  No edema; No acute deformity      Assessment and Plan:  Coronary artery disease Coronary CTA 06/2023 showed coronary calcium  score 3249 (93rd percentile) and total plaque volume of 2296 (96 percentile) with mild to moderate stenosis.  FFR analysis did not show significant stenosis TTE 09/2023 ([facility]) with LVEF 55 to 60% without wall motion abnormalities - Today patient reports his chest pains have returned after initially improving.  He reports episodes of intermittent chest pains over the past 3 weeks occurring about every other day lasting 15 to 30 minutes at a time.  Reports pain starts in the mid back and radiates to anterior chest.  His symptoms did resolve with heartburn treatment in the past - EKG without acute ischemic changes and chronic RBBB - Discussed case with DOD Dr. Wonda who suggested initiation of antianginal therapy prior to further ischemic testing - Plan to start metoprolol  succinate 25 mg daily - Can consider stress testing if symptoms do not resolve  Thoracic aortic aneurysm Coronary CTA 06/2023 showed aortic root dilation to 41.6 mm at the level of sinus of Valsalva TTE 09/2023 ([facility]) showed aortic sinus mildly dilated at 4.3 cm - Can plan for CTA aorta x 1 year for routine evaluation - Maintain adequate blood pressure control  Hyperlipidemia LDL 53 on 11/2023 and well-controlled - Continue rosuvastatin  20 mg daily  Paroxysmal atrial fibrillation ZIO 06/2023 showed <1% PAF burden Episode of A-fib with RVR on 09/2023 during EMS evaluation and resolved by  arrival to the ED Repeat ZIO 10/2023 with no episodes of PAF or sustained arrhythmias - He denies any symptoms concerning for recurrent atrial fibrillation.  He denies any palpitations or episodes of tachycardia - We discussed initiation of DOAC.  He politely deferred at this time and understands his stroke risk - Continue aspirin 81 mg daily - Start metoprolol  succinate 25 mg daily - His CHA2DS2-VASc is 3 for vascular disease and age  Right bundle branch block Chronic and benign finding overall  Right ventricular enlargement Coronary CTA 06/2023 showed right atrium and ventricle visually appeared dilated TTE 09/2023 ([facility]) showed normal right ventricular function and size - No further interventions warranted at  this time  Mild aortic regurgitation TTE 09/2023 ([facility]) showed mild aortic regurgitation - No further interventions warranted at this time  Obstructive sleep apnea Diagnosed several years ago  - Tells me in the past it was recommended for him to start CPAP but he deferred at that time - Furthermore he defers repeat sleep study at this time      Dispo:  Return in about 6 weeks (around 02/21/2024).  Signed, Lum LITTIE Louis, NP  "

## 2024-02-19 ENCOUNTER — Ambulatory Visit (HOSPITAL_COMMUNITY)

## 2024-02-28 ENCOUNTER — Ambulatory Visit: Admitting: Emergency Medicine

## 2024-03-27 ENCOUNTER — Ambulatory Visit (HOSPITAL_COMMUNITY)

## 2024-03-30 ENCOUNTER — Ambulatory Visit: Admitting: Emergency Medicine
